# Patient Record
Sex: Male | Born: 1940 | Race: White | Hispanic: No | Smoking: Current some day smoker
Health system: Southern US, Community
[De-identification: ages and names within clinical notes are randomized; demographics above are authoritative.]

## PROBLEM LIST (undated history)

## (undated) DIAGNOSIS — R0609 Other forms of dyspnea: Secondary | ICD-10-CM

## (undated) DIAGNOSIS — M1990 Unspecified osteoarthritis, unspecified site: Secondary | ICD-10-CM

## (undated) DIAGNOSIS — F329 Major depressive disorder, single episode, unspecified: Secondary | ICD-10-CM

## (undated) DIAGNOSIS — K08109 Complete loss of teeth, unspecified cause, unspecified class: Secondary | ICD-10-CM

## (undated) DIAGNOSIS — C61 Malignant neoplasm of prostate: Secondary | ICD-10-CM

## (undated) DIAGNOSIS — R351 Nocturia: Secondary | ICD-10-CM

## (undated) DIAGNOSIS — K219 Gastro-esophageal reflux disease without esophagitis: Secondary | ICD-10-CM

## (undated) DIAGNOSIS — J449 Chronic obstructive pulmonary disease, unspecified: Secondary | ICD-10-CM

## (undated) DIAGNOSIS — F419 Anxiety disorder, unspecified: Secondary | ICD-10-CM

## (undated) DIAGNOSIS — R0600 Dyspnea, unspecified: Secondary | ICD-10-CM

## (undated) DIAGNOSIS — F32A Depression, unspecified: Secondary | ICD-10-CM

## (undated) DIAGNOSIS — K000 Anodontia: Secondary | ICD-10-CM

## (undated) DIAGNOSIS — G4730 Sleep apnea, unspecified: Secondary | ICD-10-CM

## (undated) DIAGNOSIS — G629 Polyneuropathy, unspecified: Secondary | ICD-10-CM

## (undated) DIAGNOSIS — Z973 Presence of spectacles and contact lenses: Secondary | ICD-10-CM

## (undated) DIAGNOSIS — Z8669 Personal history of other diseases of the nervous system and sense organs: Secondary | ICD-10-CM

## (undated) DIAGNOSIS — H43392 Other vitreous opacities, left eye: Secondary | ICD-10-CM

## (undated) DIAGNOSIS — R51 Headache: Secondary | ICD-10-CM

## (undated) HISTORY — PX: CERVICAL DISC SURGERY: SHX588

## (undated) HISTORY — PX: PROSTATE BIOPSY: SHX241

## (undated) HISTORY — PX: CATARACT EXTRACTION W/ INTRAOCULAR LENS IMPLANT: SHX1309

---

## 2001-03-22 ENCOUNTER — Encounter: Admission: RE | Admit: 2001-03-22 | Discharge: 2001-06-20 | Payer: Self-pay

## 2001-08-18 ENCOUNTER — Encounter: Payer: Self-pay | Admitting: Neurosurgery

## 2001-08-18 ENCOUNTER — Encounter: Admission: RE | Admit: 2001-08-18 | Discharge: 2001-08-18 | Payer: Self-pay | Admitting: Neurosurgery

## 2001-09-06 ENCOUNTER — Encounter: Payer: Self-pay | Admitting: Neurosurgery

## 2001-09-12 ENCOUNTER — Encounter: Payer: Self-pay | Admitting: Neurosurgery

## 2001-09-12 ENCOUNTER — Ambulatory Visit (HOSPITAL_COMMUNITY): Admission: RE | Admit: 2001-09-12 | Discharge: 2001-09-13 | Payer: Self-pay | Admitting: Neurosurgery

## 2001-11-21 ENCOUNTER — Encounter: Payer: Self-pay | Admitting: Neurosurgery

## 2001-11-21 ENCOUNTER — Encounter: Admission: RE | Admit: 2001-11-21 | Discharge: 2001-11-21 | Payer: Self-pay | Admitting: Neurosurgery

## 2004-07-30 ENCOUNTER — Ambulatory Visit (HOSPITAL_COMMUNITY): Admission: RE | Admit: 2004-07-30 | Discharge: 2004-07-30 | Payer: Self-pay | Admitting: General Surgery

## 2004-11-28 ENCOUNTER — Ambulatory Visit (HOSPITAL_COMMUNITY): Admission: RE | Admit: 2004-11-28 | Discharge: 2004-11-28 | Payer: Self-pay | Admitting: Family Medicine

## 2005-06-03 ENCOUNTER — Ambulatory Visit (HOSPITAL_COMMUNITY): Admission: RE | Admit: 2005-06-03 | Discharge: 2005-06-03 | Payer: Self-pay | Admitting: General Surgery

## 2005-09-01 ENCOUNTER — Emergency Department (HOSPITAL_COMMUNITY): Admission: EM | Admit: 2005-09-01 | Discharge: 2005-09-01 | Payer: Self-pay | Admitting: Emergency Medicine

## 2011-01-26 ENCOUNTER — Other Ambulatory Visit: Payer: Self-pay | Admitting: Neurosurgery

## 2011-01-26 DIAGNOSIS — M542 Cervicalgia: Secondary | ICD-10-CM

## 2011-02-01 ENCOUNTER — Ambulatory Visit
Admission: RE | Admit: 2011-02-01 | Discharge: 2011-02-01 | Disposition: A | Discharge status: Home / Self Care | Payer: Medicare Other | Source: Ambulatory Visit | Attending: Neurosurgery | Admitting: Neurosurgery

## 2011-02-01 ENCOUNTER — Other Ambulatory Visit: Payer: Self-pay | Admitting: Neurosurgery

## 2011-02-01 DIAGNOSIS — M542 Cervicalgia: Secondary | ICD-10-CM

## 2011-02-02 ENCOUNTER — Other Ambulatory Visit: Payer: Self-pay

## 2011-02-04 ENCOUNTER — Ambulatory Visit
Admission: RE | Admit: 2011-02-04 | Discharge: 2011-02-04 | Disposition: A | Discharge status: Home / Self Care | Payer: Medicare Other | Source: Ambulatory Visit | Attending: Neurosurgery | Admitting: Neurosurgery

## 2012-05-25 ENCOUNTER — Other Ambulatory Visit: Payer: Self-pay | Admitting: Neurosurgery

## 2012-05-26 ENCOUNTER — Encounter (HOSPITAL_COMMUNITY): Payer: Self-pay | Admitting: Pharmacy Technician

## 2012-05-31 ENCOUNTER — Encounter (HOSPITAL_COMMUNITY): Payer: Self-pay

## 2012-05-31 ENCOUNTER — Encounter (HOSPITAL_COMMUNITY)
Admission: RE | Admit: 2012-05-31 | Discharge: 2012-05-31 | Disposition: A | Discharge status: Home / Self Care | Payer: PRIVATE HEALTH INSURANCE | Source: Ambulatory Visit | Attending: Neurosurgery | Admitting: Neurosurgery

## 2012-05-31 ENCOUNTER — Encounter (HOSPITAL_COMMUNITY)
Admission: RE | Admit: 2012-05-31 | Discharge: 2012-05-31 | Disposition: A | Discharge status: Home / Self Care | Payer: PRIVATE HEALTH INSURANCE | Source: Ambulatory Visit | Attending: Anesthesiology | Admitting: Anesthesiology

## 2012-05-31 HISTORY — DX: Headache: R51

## 2012-05-31 HISTORY — DX: Major depressive disorder, single episode, unspecified: F32.9

## 2012-05-31 HISTORY — DX: Gastro-esophageal reflux disease without esophagitis: K21.9

## 2012-05-31 HISTORY — DX: Chronic obstructive pulmonary disease, unspecified: J44.9

## 2012-05-31 HISTORY — DX: Depression, unspecified: F32.A

## 2012-05-31 LAB — CBC WITH DIFFERENTIAL/PLATELET
Basophils Absolute: 0.1 10*3/uL (ref 0.0–0.1)
Eosinophils Relative: 6 % — ABNORMAL HIGH (ref 0–5)
Lymphocytes Relative: 31 % (ref 12–46)
Lymphs Abs: 3.2 10*3/uL (ref 0.7–4.0)
MCV: 92.2 fL (ref 78.0–100.0)
Monocytes Relative: 10 % (ref 3–12)
Neutrophils Relative %: 51 % (ref 43–77)

## 2012-05-31 LAB — SURGICAL PCR SCREEN: MRSA, PCR: NEGATIVE

## 2012-05-31 NOTE — Pre-Procedure Instructions (Signed)
Todd Keith  05/31/2012   Your procedure is scheduled on:  06-05-2012  Report to [facility] at 11:00 AM.  Call this number if you have problems the morning of surgery: [phone]   Remember:   Do not eat food or drink liquids after midnight.   Take these medicines the morning of surgery with A SIP OF WATER:inhaler as needed,cymbalta,claritin,pain medications as needed    Do not wear jewelry,  Do not wear lotions, powders, or perfumes. You may wear deodorant.  Do not shave 48 hours prior to surgery. Men may shave face and neck.  Do not bring valuables to the hospital.  Contacts, dentures or bridgework may not be worn into surgery.  Leave suitcase in the car. After surgery it may be brought to your room.   For patients admitted to the hospital, checkout time is 11:00 AM the day of discharge.   Patients discharged the day of surgery will not be allowed to drive home.   Special Instructions: Shower using CHG 2 nights before surgery and the night before surgery.  If you shower the day of surgery use CHG.  Use special wash - you have one bottle of CHG for all showers.  You should use approximately 1/3 of the bottle for each shower.   Please read over the following fact sheets that you were given: Pain Booklet, Coughing and Deep Breathing and Surgical Site Infection Prevention

## 2012-06-01 ENCOUNTER — Inpatient Hospital Stay (HOSPITAL_COMMUNITY): Admission: RE | Admit: 2012-06-01 | Payer: Medicare Other | Source: Ambulatory Visit

## 2012-06-04 MED ORDER — CEFAZOLIN SODIUM-DEXTROSE 2-3 GM-% IV SOLR
2.0000 g | INTRAVENOUS | Status: AC
  Administered 2012-06-05: 2 g via INTRAVENOUS
  Filled 2012-06-04: qty 50

## 2012-06-05 ENCOUNTER — Encounter (HOSPITAL_COMMUNITY): Payer: Self-pay

## 2012-06-05 ENCOUNTER — Encounter (HOSPITAL_COMMUNITY): Payer: Self-pay | Admitting: Anesthesiology

## 2012-06-05 ENCOUNTER — Inpatient Hospital Stay (HOSPITAL_COMMUNITY): Payer: PRIVATE HEALTH INSURANCE

## 2012-06-05 ENCOUNTER — Inpatient Hospital Stay (HOSPITAL_COMMUNITY)
Admission: RE | Admit: 2012-06-05 | Discharge: 2012-06-06 | DRG: 473 | Disposition: A | Discharge status: Home / Self Care | Payer: PRIVATE HEALTH INSURANCE | Source: Ambulatory Visit | Attending: Neurosurgery | Admitting: Neurosurgery

## 2012-06-05 ENCOUNTER — Inpatient Hospital Stay (HOSPITAL_COMMUNITY): Payer: PRIVATE HEALTH INSURANCE | Admitting: Anesthesiology

## 2012-06-05 ENCOUNTER — Encounter (HOSPITAL_COMMUNITY)
Admission: RE | Disposition: A | Discharge status: Home / Self Care | Payer: Self-pay | Source: Ambulatory Visit | Attending: Neurosurgery

## 2012-06-05 DIAGNOSIS — Z01812 Encounter for preprocedural laboratory examination: Secondary | ICD-10-CM

## 2012-06-05 DIAGNOSIS — F3289 Other specified depressive episodes: Secondary | ICD-10-CM | POA: Diagnosis present

## 2012-06-05 DIAGNOSIS — Z79899 Other long term (current) drug therapy: Secondary | ICD-10-CM

## 2012-06-05 DIAGNOSIS — Z981 Arthrodesis status: Secondary | ICD-10-CM

## 2012-06-05 DIAGNOSIS — J449 Chronic obstructive pulmonary disease, unspecified: Secondary | ICD-10-CM | POA: Diagnosis present

## 2012-06-05 DIAGNOSIS — J4489 Other specified chronic obstructive pulmonary disease: Secondary | ICD-10-CM | POA: Diagnosis present

## 2012-06-05 DIAGNOSIS — F17200 Nicotine dependence, unspecified, uncomplicated: Secondary | ICD-10-CM | POA: Diagnosis present

## 2012-06-05 DIAGNOSIS — M47812 Spondylosis without myelopathy or radiculopathy, cervical region: Principal | ICD-10-CM | POA: Diagnosis present

## 2012-06-05 DIAGNOSIS — F329 Major depressive disorder, single episode, unspecified: Secondary | ICD-10-CM | POA: Diagnosis present

## 2012-06-05 DIAGNOSIS — K219 Gastro-esophageal reflux disease without esophagitis: Secondary | ICD-10-CM | POA: Diagnosis present

## 2012-06-05 HISTORY — PX: ANTERIOR CERVICAL DECOMP/DISCECTOMY FUSION: SHX1161

## 2012-06-05 SURGERY — ANTERIOR CERVICAL DECOMPRESSION/DISCECTOMY FUSION 1 LEVEL/HARDWARE REMOVAL
Anesthesia: General | Site: Neck | Wound class: Clean

## 2012-06-05 MED ORDER — DEXAMETHASONE SODIUM PHOSPHATE 10 MG/ML IJ SOLN
10.0000 mg | INTRAMUSCULAR | Status: AC
  Administered 2012-06-05: 10 mg via INTRAVENOUS
  Filled 2012-06-05: qty 1

## 2012-06-05 MED ORDER — HYDROMORPHONE HCL PF 1 MG/ML IJ SOLN
0.2500 mg | INTRAMUSCULAR | Status: DC | PRN
  Administered 2012-06-05 (×2): 0.5 mg via INTRAVENOUS

## 2012-06-05 MED ORDER — HYDROMORPHONE HCL PF 1 MG/ML IJ SOLN
0.5000 mg | INTRAMUSCULAR | Status: DC | PRN
  Administered 2012-06-05 (×2): 1 mg via INTRAVENOUS
  Filled 2012-06-05 (×2): qty 1

## 2012-06-05 MED ORDER — ARTIFICIAL TEARS OP OINT
TOPICAL_OINTMENT | OPHTHALMIC | Status: DC | PRN
  Administered 2012-06-05: 1 via OPHTHALMIC

## 2012-06-05 MED ORDER — PHENOL 1.4 % MT LIQD
1.0000 | OROMUCOSAL | Status: DC | PRN
  Administered 2012-06-05: 1 via OROMUCOSAL
  Filled 2012-06-05: qty 177

## 2012-06-05 MED ORDER — THROMBIN 5000 UNITS EX SOLR
CUTANEOUS | Status: DC | PRN
  Administered 2012-06-05 (×2): 5000 [IU] via TOPICAL

## 2012-06-05 MED ORDER — HYDROMORPHONE HCL PF 1 MG/ML IJ SOLN
INTRAMUSCULAR | Status: DC | PRN
  Administered 2012-06-05: 1 mg via INTRAVENOUS

## 2012-06-05 MED ORDER — THROMBIN 5000 UNITS EX SOLR
OROMUCOSAL | Status: DC | PRN
  Administered 2012-06-05: 15:00:00 via TOPICAL

## 2012-06-05 MED ORDER — ALBUTEROL SULFATE HFA 108 (90 BASE) MCG/ACT IN AERS
2.0000 | INHALATION_SPRAY | Freq: Four times a day (QID) | RESPIRATORY_TRACT | Status: DC
  Administered 2012-06-05 – 2012-06-06 (×3): 2 via RESPIRATORY_TRACT
  Filled 2012-06-05: qty 6.7

## 2012-06-05 MED ORDER — OXYCODONE HCL 5 MG/5ML PO SOLN: 5.0000 mg | Freq: Once | ORAL | Status: AC | PRN

## 2012-06-05 MED ORDER — FENTANYL CITRATE 0.05 MG/ML IJ SOLN
INTRAMUSCULAR | Status: DC | PRN
  Administered 2012-06-05: 100 ug via INTRAVENOUS
  Administered 2012-06-05 (×2): 50 ug via INTRAVENOUS

## 2012-06-05 MED ORDER — GABAPENTIN 300 MG PO CAPS
300.0000 mg | ORAL_CAPSULE | Freq: Two times a day (BID) | ORAL | Status: DC
  Administered 2012-06-05 – 2012-06-06 (×2): 300 mg via ORAL
  Filled 2012-06-05 (×3): qty 1

## 2012-06-05 MED ORDER — ALUM & MAG HYDROXIDE-SIMETH 200-200-20 MG/5ML PO SUSP: 30.0000 mL | Freq: Four times a day (QID) | ORAL | Status: DC | PRN

## 2012-06-05 MED ORDER — SENNA 8.6 MG PO TABS
1.0000 | ORAL_TABLET | Freq: Two times a day (BID) | ORAL | Status: DC
  Administered 2012-06-05 – 2012-06-06 (×2): 8.6 mg via ORAL
  Filled 2012-06-05 (×3): qty 1

## 2012-06-05 MED ORDER — CYCLOBENZAPRINE HCL 10 MG PO TABS
10.0000 mg | ORAL_TABLET | Freq: Three times a day (TID) | ORAL | Status: DC | PRN
  Administered 2012-06-05 – 2012-06-06 (×2): 10 mg via ORAL
  Filled 2012-06-05 (×2): qty 1

## 2012-06-05 MED ORDER — HYDROMORPHONE HCL PF 1 MG/ML IJ SOLN
INTRAMUSCULAR | Status: AC
  Filled 2012-06-05: qty 1

## 2012-06-05 MED ORDER — SODIUM CHLORIDE 0.9 % IJ SOLN
3.0000 mL | Freq: Two times a day (BID) | INTRAMUSCULAR | Status: DC
  Administered 2012-06-05 – 2012-06-06 (×2): 3 mL via INTRAVENOUS

## 2012-06-05 MED ORDER — MENTHOL 3 MG MT LOZG
1.0000 | LOZENGE | OROMUCOSAL | Status: DC | PRN
  Filled 2012-06-05: qty 9

## 2012-06-05 MED ORDER — DULOXETINE HCL 60 MG PO CPEP
60.0000 mg | ORAL_CAPSULE | Freq: Every day | ORAL | Status: DC
  Administered 2012-06-05 – 2012-06-06 (×2): 60 mg via ORAL
  Filled 2012-06-05 (×2): qty 1

## 2012-06-05 MED ORDER — OXYCODONE HCL 5 MG PO TABS
ORAL_TABLET | ORAL | Status: AC
  Filled 2012-06-05: qty 1

## 2012-06-05 MED ORDER — SODIUM CHLORIDE 0.9 % IJ SOLN: 3.0000 mL | INTRAMUSCULAR | Status: DC | PRN

## 2012-06-05 MED ORDER — SODIUM CHLORIDE 0.9 % IV SOLN
INTRAVENOUS | Status: AC
  Filled 2012-06-05: qty 500

## 2012-06-05 MED ORDER — PROPOFOL 10 MG/ML IV BOLUS
INTRAVENOUS | Status: DC | PRN
  Administered 2012-06-05: 150 mg via INTRAVENOUS

## 2012-06-05 MED ORDER — ONDANSETRON HCL 4 MG/2ML IJ SOLN: 4.0000 mg | Freq: Four times a day (QID) | INTRAMUSCULAR | Status: DC | PRN

## 2012-06-05 MED ORDER — VECURONIUM BROMIDE 10 MG IV SOLR
INTRAVENOUS | Status: DC | PRN
  Administered 2012-06-05: 2 mg via INTRAVENOUS

## 2012-06-05 MED ORDER — ONDANSETRON HCL 4 MG/2ML IJ SOLN
INTRAMUSCULAR | Status: DC | PRN
  Administered 2012-06-05: 4 mg via INTRAVENOUS

## 2012-06-05 MED ORDER — LACTATED RINGERS IV SOLN
INTRAVENOUS | Status: DC | PRN
  Administered 2012-06-05 (×2): via INTRAVENOUS

## 2012-06-05 MED ORDER — MORPHINE SULFATE 15 MG PO TABS
30.0000 mg | ORAL_TABLET | ORAL | Status: DC | PRN
  Administered 2012-06-06 (×2): 30 mg via ORAL
  Filled 2012-06-05 (×2): qty 2

## 2012-06-05 MED ORDER — GLYCOPYRROLATE 0.2 MG/ML IJ SOLN
INTRAMUSCULAR | Status: DC | PRN
  Administered 2012-06-05: 0.6 mg via INTRAVENOUS

## 2012-06-05 MED ORDER — ONDANSETRON HCL 4 MG/2ML IJ SOLN: 4.0000 mg | INTRAMUSCULAR | Status: DC | PRN

## 2012-06-05 MED ORDER — SODIUM CHLORIDE 0.9 % IR SOLN
Status: DC | PRN
  Administered 2012-06-05: 12:00:00

## 2012-06-05 MED ORDER — OXYCODONE HCL 5 MG PO TABS
5.0000 mg | ORAL_TABLET | Freq: Once | ORAL | Status: AC | PRN
Start: 2012-06-05 — End: 2012-06-05
  Administered 2012-06-05: 5 mg via ORAL

## 2012-06-05 MED ORDER — BACITRACIN 50000 UNITS IM SOLR
INTRAMUSCULAR | Status: AC
  Filled 2012-06-05: qty 1

## 2012-06-05 MED ORDER — ACETAMINOPHEN 325 MG PO TABS: 650.0000 mg | ORAL_TABLET | ORAL | Status: DC | PRN

## 2012-06-05 MED ORDER — ROCURONIUM BROMIDE 100 MG/10ML IV SOLN
INTRAVENOUS | Status: DC | PRN
  Administered 2012-06-05: 50 mg via INTRAVENOUS

## 2012-06-05 MED ORDER — ACETAMINOPHEN 650 MG RE SUPP: 650.0000 mg | RECTAL | Status: DC | PRN

## 2012-06-05 MED ORDER — LORATADINE 10 MG PO TABS
10.0000 mg | ORAL_TABLET | Freq: Every day | ORAL | Status: DC
  Administered 2012-06-05 – 2012-06-06 (×2): 10 mg via ORAL
  Filled 2012-06-05 (×2): qty 1

## 2012-06-05 MED ORDER — TIZANIDINE HCL 4 MG PO TABS
4.0000 mg | ORAL_TABLET | Freq: Three times a day (TID) | ORAL | Status: DC
  Administered 2012-06-05 – 2012-06-06 (×3): 4 mg via ORAL
  Filled 2012-06-05 (×5): qty 1

## 2012-06-05 MED ORDER — NEOSTIGMINE METHYLSULFATE 1 MG/ML IJ SOLN
INTRAMUSCULAR | Status: DC | PRN
  Administered 2012-06-05: 4 mg via INTRAVENOUS

## 2012-06-05 MED ORDER — CEFAZOLIN SODIUM 1-5 GM-% IV SOLN
1.0000 g | Freq: Three times a day (TID) | INTRAVENOUS | Status: AC
  Administered 2012-06-05 – 2012-06-06 (×2): 1 g via INTRAVENOUS
  Filled 2012-06-05 (×2): qty 50

## 2012-06-05 MED ORDER — HEMOSTATIC AGENTS (NO CHARGE) OPTIME
TOPICAL | Status: DC | PRN
  Administered 2012-06-05: 1 via TOPICAL

## 2012-06-05 MED ORDER — OXYCODONE-ACETAMINOPHEN 5-325 MG PO TABS
1.0000 | ORAL_TABLET | ORAL | Status: DC | PRN
  Administered 2012-06-05 – 2012-06-06 (×3): 2 via ORAL
  Filled 2012-06-05 (×3): qty 2

## 2012-06-05 MED ORDER — 0.9 % SODIUM CHLORIDE (POUR BTL) OPTIME
TOPICAL | Status: DC | PRN
  Administered 2012-06-05: 1000 mL

## 2012-06-05 MED ORDER — LIDOCAINE HCL (CARDIAC) 20 MG/ML IV SOLN
INTRAVENOUS | Status: DC | PRN
  Administered 2012-06-05: 50 mg via INTRAVENOUS

## 2012-06-05 MED ORDER — HYDROCODONE-ACETAMINOPHEN 5-325 MG PO TABS: 1.0000 | ORAL_TABLET | ORAL | Status: DC | PRN

## 2012-06-05 MED ORDER — NORTRIPTYLINE HCL 25 MG PO CAPS
50.0000 mg | ORAL_CAPSULE | Freq: Every day | ORAL | Status: DC
  Administered 2012-06-05: 50 mg via ORAL
  Filled 2012-06-05 (×2): qty 2

## 2012-06-05 MED ORDER — ZOLPIDEM TARTRATE 5 MG PO TABS: 5.0000 mg | ORAL_TABLET | Freq: Every evening | ORAL | Status: DC | PRN

## 2012-06-05 SURGICAL SUPPLY — 56 items
BAG DECANTER FOR FLEXI CONT (MISCELLANEOUS) ×2 IMPLANT
BENZOIN TINCTURE PRP APPL 2/3 (GAUZE/BANDAGES/DRESSINGS) ×2 IMPLANT
BIT DRILL NEURO 2X3.1 SFT TUCH (MISCELLANEOUS) ×1 IMPLANT
BRUSH SCRUB EZ PLAIN DRY (MISCELLANEOUS) ×2 IMPLANT
BUR MATCHSTICK NEURO 3.0 LAGG (BURR) ×2 IMPLANT
CANISTER SUCTION 2500CC (MISCELLANEOUS) ×2 IMPLANT
CLOTH BEACON ORANGE TIMEOUT ST (SAFETY) ×2 IMPLANT
CONT SPEC 4OZ CLIKSEAL STRL BL (MISCELLANEOUS) ×2 IMPLANT
DRAPE C-ARM 42X72 X-RAY (DRAPES) ×4 IMPLANT
DRAPE LAPAROTOMY 100X72 PEDS (DRAPES) ×2 IMPLANT
DRAPE MICROSCOPE ZEISS OPMI (DRAPES) ×2 IMPLANT
DRAPE POUCH INSTRU U-SHP 10X18 (DRAPES) ×2 IMPLANT
DRILL BIT (BIT) ×2 IMPLANT
DRILL NEURO 2X3.1 SOFT TOUCH (MISCELLANEOUS) ×2
ELECT COATED BLADE 2.86 ST (ELECTRODE) ×2 IMPLANT
ELECT REM PT RETURN 9FT ADLT (ELECTROSURGICAL) ×2
ELECTRODE REM PT RTRN 9FT ADLT (ELECTROSURGICAL) ×1 IMPLANT
EVACUATOR 1/8 PVC DRAIN (DRAIN) ×2 IMPLANT
GAUZE SPONGE 4X4 16PLY XRAY LF (GAUZE/BANDAGES/DRESSINGS) IMPLANT
GLOVE BIO SURGEON STRL SZ8.5 (GLOVE) ×2 IMPLANT
GLOVE ECLIPSE 7.5 STRL STRAW (GLOVE) ×8 IMPLANT
GLOVE ECLIPSE 8.5 STRL (GLOVE) ×2 IMPLANT
GLOVE EXAM NITRILE LRG STRL (GLOVE) IMPLANT
GLOVE EXAM NITRILE MD LF STRL (GLOVE) IMPLANT
GLOVE EXAM NITRILE XL STR (GLOVE) IMPLANT
GLOVE EXAM NITRILE XS STR PU (GLOVE) IMPLANT
GLOVE INDICATOR 8.0 STRL GRN (GLOVE) ×4 IMPLANT
GLOVE OPTIFIT SS 8.0 STRL (GLOVE) ×2 IMPLANT
GOWN BRE IMP SLV AUR LG STRL (GOWN DISPOSABLE) IMPLANT
GOWN BRE IMP SLV AUR XL STRL (GOWN DISPOSABLE) ×2 IMPLANT
GOWN STRL REIN 2XL LVL4 (GOWN DISPOSABLE) ×6 IMPLANT
HEAD HALTER (SOFTGOODS) ×2 IMPLANT
HEMOSTAT POWDER SURGIFOAM 1G (HEMOSTASIS) ×2 IMPLANT
KIT BASIN OR (CUSTOM PROCEDURE TRAY) ×2 IMPLANT
KIT ROOM TURNOVER OR (KITS) ×2 IMPLANT
NEEDLE SPNL 20GX3.5 QUINCKE YW (NEEDLE) ×2 IMPLANT
NS IRRIG 1000ML POUR BTL (IV SOLUTION) ×2 IMPLANT
PACK LAMINECTOMY NEURO (CUSTOM PROCEDURE TRAY) ×2 IMPLANT
PAD ARMBOARD 7.5X6 YLW CONV (MISCELLANEOUS) ×6 IMPLANT
PLATE ELITE VISION 25MM (Plate) ×2 IMPLANT
RUBBERBAND STERILE (MISCELLANEOUS) ×4 IMPLANT
SCREW ST 13X4XST VA NS SPNE (Screw) ×4 IMPLANT
SCREW ST VAR 4 ATL (Screw) ×4 IMPLANT
SPACER BONE CORNERSTONE 7X14 (Orthopedic Implant) ×2 IMPLANT
SPONGE GAUZE 4X4 12PLY (GAUZE/BANDAGES/DRESSINGS) ×2 IMPLANT
SPONGE INTESTINAL PEANUT (DISPOSABLE) ×2 IMPLANT
SPONGE SURGIFOAM ABS GEL SZ50 (HEMOSTASIS) ×2 IMPLANT
STRIP CLOSURE SKIN 1/2X4 (GAUZE/BANDAGES/DRESSINGS) ×2 IMPLANT
SUT PDS AB 5-0 P3 18 (SUTURE) ×2 IMPLANT
SUT VIC AB 3-0 SH 8-18 (SUTURE) ×2 IMPLANT
SYR 20ML ECCENTRIC (SYRINGE) ×2 IMPLANT
TAPE CLOTH 4X10 WHT NS (GAUZE/BANDAGES/DRESSINGS) ×2 IMPLANT
TAPE CLOTH SURG 4X10 WHT LF (GAUZE/BANDAGES/DRESSINGS) ×2 IMPLANT
TOWEL OR 17X24 6PK STRL BLUE (TOWEL DISPOSABLE) ×2 IMPLANT
TOWEL OR 17X26 10 PK STRL BLUE (TOWEL DISPOSABLE) ×2 IMPLANT
WATER STERILE IRR 1000ML POUR (IV SOLUTION) ×2 IMPLANT

## 2012-06-05 NOTE — Anesthesia Preprocedure Evaluation (Addendum)
Anesthesia Evaluation  Patient identified by MRN, date of birth, ID band Patient awake    Reviewed: Allergy & Precautions, H&P , NPO status , Patient's Chart, lab work & pertinent test results  Airway Mallampati: II  Neck ROM: full    Dental  (+) Dental Advidsory Given   Pulmonary COPDCurrent Smoker,          Cardiovascular     Neuro/Psych  Headaches, Depression    GI/Hepatic GERD-  ,  Endo/Other    Renal/GU      Musculoskeletal   Abdominal   Peds  Hematology   Anesthesia Other Findings   Reproductive/Obstetrics                          Anesthesia Physical Anesthesia Plan  ASA: II  Anesthesia Plan: General   Post-op Pain Management:    Induction: Intravenous  Airway Management Planned: Oral ETT  Additional Equipment:   Intra-op Plan:   Post-operative Plan: Extubation in OR  Informed Consent: I have reviewed the patients History and Physical, chart, labs and discussed the procedure including the risks, benefits and alternatives for the proposed anesthesia with the patient or authorized representative who has indicated his/her understanding and acceptance.   Dental Advisory Given  Plan Discussed with: CRNA, Surgeon and Anesthesiologist  Anesthesia Plan Comments:        Anesthesia Quick Evaluation

## 2012-06-05 NOTE — Transfer of Care (Signed)
Immediate Anesthesia Transfer of Care Note  Patient: Todd Keith  Procedure(s) Performed: Procedure(s): Cervical six - seven  ANTERIOR CERVICAL DECOMPRESSION/DISCECTOMY FUSION 1 LEVEL REMOVAL OF HARDWARE Cervical four - six (N/A)  Patient Location: PACU  Anesthesia Type:General  Level of Consciousness: awake, alert  and oriented  Airway & Oxygen Therapy: Patient Spontanous Breathing and Patient connected to face mask oxygen  Post-op Assessment: Report given to PACU RN  Post vital signs: Reviewed and stable  Complications: No apparent anesthesia complications

## 2012-06-05 NOTE — Anesthesia Procedure Notes (Signed)
Procedure Name: Intubation Date/Time: 06/05/2012 1:20 PM Performed by: Carmela Rima Pre-anesthesia Checklist: Patient identified, Timeout performed, Emergency Drugs available, Suction available and Patient being monitored Patient Re-evaluated:Patient Re-evaluated prior to inductionOxygen Delivery Method: Circle system utilized Preoxygenation: Pre-oxygenation with 100% oxygen Intubation Type: IV induction Ventilation: Mask ventilation without difficulty Laryngoscope Size: Mac and 3 Grade View: Grade I Tube type: Oral Tube size: 7.5 mm Number of attempts: 1 Placement Confirmation: ETT inserted through vocal cords under direct vision,  positive ETCO2 and breath sounds checked- equal and bilateral Secured at: 23 cm Tube secured with: Tape Dental Injury: Teeth and Oropharynx as per pre-operative assessment

## 2012-06-05 NOTE — Plan of Care (Signed)
Problem: Consults Goal: Diagnosis - Spinal Surgery Outcome: Completed/Met Date Met:  06/05/12 Cervical Spine Fusion     

## 2012-06-05 NOTE — Anesthesia Postprocedure Evaluation (Signed)
Anesthesia Post Note  Patient: Todd Keith  Procedure(s) Performed: Procedure(s) (LRB): Cervical six - seven  ANTERIOR CERVICAL DECOMPRESSION/DISCECTOMY FUSION 1 LEVEL REMOVAL OF HARDWARE Cervical four - six (N/A)  Anesthesia type: General  Patient location: PACU  Post pain: Pain level controlled and Adequate analgesia  Post assessment: Post-op Vital signs reviewed, Patient's Cardiovascular Status Stable, Respiratory Function Stable, Patent Airway and Pain level controlled  Last Vitals:  Filed Vitals:   06/05/12 1525  BP: 139/75  Pulse: 106  Temp: 37.3 C  Resp: 12    Post vital signs: Reviewed and stable  Level of consciousness: awake, alert  and oriented  Complications: No apparent anesthesia complications

## 2012-06-05 NOTE — H&P (Signed)
Todd Keith is an 72 y.o. male.   Chief Complaint: Neck and bilateral upper extremity pain HPI: 72 year old male status post previous C3-C6 anterior cervical discectomies infusion presents with worsening neck and bilateral R. pain. Symptoms have been refractory to conservative management. Patient is miserable with this current situation. Workup demonstrates evidence of marked spondylosis and stenosis at C6-7 with spinal cord compression and exiting nerve root compression bilaterally. Patient presents now for C6-7 anterior cervical decompression infusion in hopes of improving his symptoms. This will require removal of his previously placed anterior plate.  Past Medical History  Diagnosis Date  . Depression   . COPD (chronic obstructive pulmonary disease)     has improved since stopped smoking  . GERD (gastroesophageal reflux disease)   . Headache     Past Surgical History  Procedure Laterality Date  . Cervical disc surgery      No family history on file. Social History:  reports that he has been smoking Cigarettes.  He has been smoking about 0.00 packs per day. He does not have any smokeless tobacco history on file. He reports that he does not drink alcohol or use illicit drugs.  Allergies: No Known Allergies  Medications Prior to Admission  Medication Sig Dispense Refill  . albuterol (PROAIR HFA) 108 (90 BASE) MCG/ACT inhaler Inhale 2 puffs into the lungs 4 (four) times daily.      . DULoxetine (CYMBALTA) 60 MG capsule Take 60 mg by mouth daily.      Marland Kitchen gabapentin (NEURONTIN) 300 MG capsule Take 300 mg by mouth 2 (two) times daily.      Marland Kitchen loratadine (CLARITIN) 10 MG tablet Take 10 mg by mouth daily.      . nortriptyline (PAMELOR) 50 MG capsule Take 50 mg by mouth at bedtime.      Marland Kitchen oxymorphone (OPANA) 10 MG tablet Take 10 mg by mouth 2 (two) times daily.      Marland Kitchen tiZANidine (ZANAFLEX) 4 MG tablet Take 4 mg by mouth 3 (three) times daily.        No results found for this or any  previous visit (from the past 48 hour(s)). No results found.  Review of Systems  Constitutional: Negative.   HENT: Negative.   Eyes: Negative.   Respiratory: Negative.   Cardiovascular: Negative.   Gastrointestinal: Negative.   Genitourinary: Negative.   Musculoskeletal: Negative.   Skin: Negative.   Neurological: Negative.   Endo/Heme/Allergies: Negative.   Psychiatric/Behavioral: Negative.     Blood pressure 106/70, pulse 79, temperature 97.1 F (36.2 C), temperature source Oral, resp. rate 18, SpO2 97.00%. Physical Exam  Nursing note and vitals reviewed. Constitutional: He is oriented to person, place, and time. He appears well-developed and well-nourished. No distress.  HENT:  Head: Normocephalic and atraumatic.  Right Ear: External ear normal.  Left Ear: External ear normal.  Nose: Nose normal.  Mouth/Throat: Oropharynx is clear and moist.  Eyes: Conjunctivae and EOM are normal. Pupils are equal, round, and reactive to light. Right eye exhibits no discharge. Left eye exhibits no discharge.  Neck: Normal range of motion. Neck supple. No tracheal deviation present. No thyromegaly present.  Cardiovascular: Normal rate, regular rhythm, normal heart sounds and intact distal pulses.  Exam reveals no friction rub.   No murmur heard. Respiratory: Effort normal and breath sounds normal. No respiratory distress. He has no wheezes.  GI: Soft. Bowel sounds are normal. He exhibits no distension. There is no tenderness.  Neurological: He is alert and oriented  to person, place, and time. He has normal reflexes. He displays normal reflexes. No cranial nerve deficit. He exhibits normal muscle tone. Coordination normal.  Skin: Skin is warm and dry. He is not diaphoretic.  Psychiatric: He has a normal mood and affect. His behavior is normal. Judgment and thought content normal.     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 Plan C6-7 anterior cervical discectomy and  fusion with allograft and her plating. Plan removal of C4-C6 anterior cervical plate. Risks and benefits have been explained. Patient wishes to proceed.  Todd Keith A 06/05/2012, 1:01 PM

## 2012-06-05 NOTE — Op Note (Signed)
Date of procedure: 06/05/2012  Date of dictation: Same  Service: Neurosurgery  Preoperative diagnosis: C6-7 spondylosis with stenosis and radiculopathy. Status post C4-C6 anterior cervical decompression and fusion with instrumentation  Postoperative diagnosis: Same  Procedure Name: C6-7 anterior cervical discectomy and fusion with allograft and her plating. Removal of C4-C6 anterior cervical plate reexploration of C4-C6 anterior cervical fusion.  Surgeon:Amillion Macchia A.Karma Ansley, M.D.  Asst. Surgeon: Lovell Sheehan  Anesthesia: General  Indication: 71 year old male status post previous C4-C6 anterior cervical discectomy and fusion without after plating remotely in the past. Patient presents now with worsening neck and bilateral upper cavity symptoms right greater than left. Workup demonstrates evidence of severe spondylosis with stenosis and neural foraminal stenosis at C6-7. Patient's failed conservative management and presents now for C6-7 anterior cervical decompression and fusion in hopes of improving his symptoms.  Operative note: After induction of general anesthesia patient positioned supine with Extend and help with halter traction. Patient's anterior cervical region was prepped and draped sterilely. 10 please make a linear skin incision overlying the C6 vertebral level. This carried down sharply through the platysma. Dissection then proceeded on the medial border of the cervical as possible and carotid sheath. Trachea and esophagus are mobilized retracted towards the left. Prevertebral fascia stripped off the anterior spinal column. Longus colli muscles elevated bilaterally using electric. Deep self-retaining traction placed intraoperative fluoroscopy is used levels were confirmed. Previous anterior cervical plate from G9-F6 was dissected free disassembled and then removed. The fusion from C4-C6 was quite solid. Disc space at C6-7 and size 15 blade in a rectangle fashion. Anterior ossifies were also  removed. A wide discectomies and performed using pituitary rongeurs for tobacco progress Kerrison or his high-speed drill per almost the disc removed down to level posterior annulus. Marked and brought field these at the remainder of the discectomy. Remaining aspects of annulus and osteophytes removed using high-speed drill down to level posterior lateral and her posterior lateral is elevated and resected so fashion using Kerrison rongeurs. Underlying thecal sac was identified. Y. center decompression and perform undercutting the bodies of C6 and C7. Decompression then proceeded extraforaminal wide anterior foraminotomies were then performed on of course exiting C7 nerve roots bilaterally. This point a very thorough decompression had been achieved. There is no his injury to thecal sac or nerve roots. Wounds that area out solution. A 7 mm cornerstone allograft wedges and packed in place and recessed approximately 1-2 mm in the anterior cortical margin of C6. A 25 mg center for plate was a posterior the C6 and C7 levels. This attached under fluoroscopic guidance and 13 motor verbal screws 2 each at both levels. All 4 screws given a final tightening be solidly within bone. Locking screws were engaged both levels. Final images revealed good position t of the screws at the proper operative level with normal alignment of the spine.. Wound is then irrigated hemostasis was assured with bipolar electrocautery. Medium Hemovac drain was left in the prevertebral space. Wounds and closed in a typical fashion. Steri-Strips and a sterile dressing  were applied. There were no apparent complications. Patient tolerated the procedure well and returns to the recovery room postop.

## 2012-06-05 NOTE — Preoperative (Signed)
Beta Blockers   Reason not to administer Beta Blockers:Not Applicable 

## 2012-06-05 NOTE — Brief Op Note (Signed)
06/05/2012  3:10 PM  PATIENT:  Todd Keith  72 y.o. male  PRE-OPERATIVE DIAGNOSIS:  HNP/stenosis  POST-OPERATIVE DIAGNOSIS:  herniated nucleus pulposis  PROCEDURE:  Procedure(s): Cervical six - seven  ANTERIOR CERVICAL DECOMPRESSION/DISCECTOMY FUSION 1 LEVEL REMOVAL OF HARDWARE Cervical four - six (N/A)  SURGEON:  Surgeon(s) and Role:    * Temple Pacini, MD - Primary    * Cristi Loron, MD - Assisting  PHYSICIAN ASSISTANT:   ASSISTANTS:    ANESTHESIA:   general  EBL:  Total I/O In: 1600 [I.V.:1600] Out: 75 [Blood:75]  BLOOD ADMINISTERED:none  DRAINS: (Medium) Hemovact drain(s) in the Prevertebral space with  Suction Open   LOCAL MEDICATIONS USED:  NONE  SPECIMEN:  No Specimen  DISPOSITION OF SPECIMEN:  N/A  COUNTS:  YES  TOURNIQUET:  * No tourniquets in log *  DICTATION: .Dragon Dictation  PLAN OF CARE: Admit to inpatient   PATIENT DISPOSITION:  PACU - hemodynamically stable.   Delay start of Pharmacological VTE agent (>24hrs) due to surgical blood loss or risk of bleeding: yes

## 2012-06-06 ENCOUNTER — Encounter (HOSPITAL_COMMUNITY): Payer: Self-pay | Admitting: Neurosurgery

## 2012-06-06 MED ORDER — OXYCODONE-ACETAMINOPHEN 5-325 MG PO TABS: 1.0000 | ORAL_TABLET | ORAL | Status: DC | PRN

## 2012-06-06 NOTE — Discharge Summary (Signed)
Physician Discharge Summary  Patient ID: BARY LIMBACH MRN: [id number] DOB/AGE: Jul 29, 1940 71 y.o.  Admit date: 06/05/2012 Discharge date: 06/06/2012  Admission Diagnoses:  Discharge Diagnoses:  Principal Problem:   Cervical spondylosis without myelopathy   Discharged Condition: good  Hospital Course: Patient in the hospital where he underwent uncomplicated C6-7 anterior cervical discectomy and fusion. Postoperatively he is doing well. His pain is much improved and he is ready for discharge home.  Consults:   Significant Diagnostic Studies:   Treatments:   Discharge Exam: Blood pressure 100/68, pulse 88, temperature 97.9 F (36.6 C), temperature source Oral, resp. rate 18, SpO2 94.00%. Awake and alert oriented and appropriate. Cranial nerve function is intact. Motor and sensory function of the extremities are stable. Wound clean and dry. Chest and abdomen benign.  Disposition:      Medication List    TAKE these medications       DULoxetine 60 MG capsule  Commonly known as:  CYMBALTA  Take 60 mg by mouth daily.     gabapentin 300 MG capsule  Commonly known as:  NEURONTIN  Take 300 mg by mouth 2 (two) times daily.     loratadine 10 MG tablet  Commonly known as:  CLARITIN  Take 10 mg by mouth daily.     nortriptyline 50 MG capsule  Commonly known as:  PAMELOR  Take 50 mg by mouth at bedtime.     oxyCODONE-acetaminophen 5-325 MG per tablet  Commonly known as:  PERCOCET/ROXICET  Take 1-2 tablets by mouth every 4 (four) hours as needed.     oxymorphone 10 MG tablet  Commonly known as:  OPANA  Take 10 mg by mouth 2 (two) times daily.     PROAIR HFA 108 (90 BASE) MCG/ACT inhaler  Generic drug:  albuterol  Inhale 2 puffs into the lungs 4 (four) times daily.     tiZANidine 4 MG tablet  Commonly known as:  ZANAFLEX  Take 4 mg by mouth 3 (three) times daily.           Follow-up Information   Follow up with Amyria Komar A, MD. Call in 1 week. (Ask for  Lurena Joiner)    Contact information:   [address] [place] [address] [phone]       Signed: Margert Edsall A 06/06/2012, 11:46 AM

## 2012-06-06 NOTE — Progress Notes (Signed)
UR COMPLETED  

## 2012-09-04 ENCOUNTER — Other Ambulatory Visit: Payer: Self-pay | Admitting: Neurosurgery

## 2012-09-04 DIAGNOSIS — M47812 Spondylosis without myelopathy or radiculopathy, cervical region: Secondary | ICD-10-CM

## 2015-02-06 ENCOUNTER — Encounter (INDEPENDENT_AMBULATORY_CARE_PROVIDER_SITE_OTHER): Payer: Self-pay | Admitting: *Deleted

## 2015-03-20 ENCOUNTER — Ambulatory Visit (INDEPENDENT_AMBULATORY_CARE_PROVIDER_SITE_OTHER): Payer: PRIVATE HEALTH INSURANCE | Admitting: Internal Medicine

## 2015-03-20 ENCOUNTER — Encounter (INDEPENDENT_AMBULATORY_CARE_PROVIDER_SITE_OTHER): Payer: Self-pay | Admitting: Internal Medicine

## 2015-03-21 ENCOUNTER — Encounter (INDEPENDENT_AMBULATORY_CARE_PROVIDER_SITE_OTHER): Payer: Self-pay | Admitting: Internal Medicine

## 2015-12-17 ENCOUNTER — Other Ambulatory Visit: Payer: Self-pay | Admitting: Pain Medicine

## 2016-03-09 ENCOUNTER — Other Ambulatory Visit: Payer: Self-pay | Admitting: Pain Medicine

## 2016-04-05 ENCOUNTER — Other Ambulatory Visit: Payer: Self-pay | Admitting: Pain Medicine

## 2017-01-04 ENCOUNTER — Encounter: Payer: Self-pay | Admitting: Radiation Oncology

## 2017-01-18 ENCOUNTER — Encounter: Payer: Self-pay | Admitting: Radiation Oncology

## 2017-01-18 NOTE — Progress Notes (Signed)
GU Location of Tumor / Histology: prostatic adenocarcinoma  If Prostate Cancer, Gleason Score is (3 + 4) and PSA is (5.91). Prostate volume: 32.93 grams  Todd Keith was referred to Dr. Junious Silk by Barry Dienes, NP on 07/21/2016 for evaluation of an elevated PSA.  Biopsies of prostate (if applicable) revealed:    Past/Anticipated interventions by urology, if any: biopsy, cystoscopy ??, CT scan of abd. Pelvis?, referral to radiation oncology  Past/Anticipated interventions by medical oncology, if any: no  Weight changes, if any: no  Bowel/Bladder complaints, if any:    Nausea/Vomiting, if any: no  Pain issues, if any:    SAFETY ISSUES:  Prior radiation?   Pacemaker/ICD?   Possible current pregnancy? no  Is the patient on methotrexate?   Current Complaints / other details:  76 year old male. Widowed with one daughter and two sons. Breast cancer-Mother. Unknown cancer - Father.

## 2017-01-19 ENCOUNTER — Telehealth: Payer: Self-pay | Admitting: Radiation Oncology

## 2017-01-19 ENCOUNTER — Ambulatory Visit: Payer: Medicare Other

## 2017-01-19 ENCOUNTER — Ambulatory Visit
Admission: RE | Admit: 2017-01-19 | Discharge: 2017-01-19 | Disposition: A | Discharge status: Home / Self Care | Payer: Medicare Other | Source: Ambulatory Visit | Attending: Radiation Oncology | Admitting: Radiation Oncology

## 2017-01-19 DIAGNOSIS — Z510 Encounter for antineoplastic radiation therapy: Secondary | ICD-10-CM | POA: Insufficient documentation

## 2017-01-19 DIAGNOSIS — C61 Malignant neoplasm of prostate: Secondary | ICD-10-CM | POA: Insufficient documentation

## 2017-01-19 DIAGNOSIS — F17200 Nicotine dependence, unspecified, uncomplicated: Secondary | ICD-10-CM | POA: Insufficient documentation

## 2017-01-19 HISTORY — DX: Malignant neoplasm of prostate: C61

## 2017-01-19 NOTE — Telephone Encounter (Signed)
Patient has not shown for appointment. Phoned to inquire. No answer at any number provided and no option to leave message.

## 2017-01-28 ENCOUNTER — Telehealth: Payer: Self-pay | Admitting: Medical Oncology

## 2017-01-28 NOTE — Telephone Encounter (Signed)
I attempted to call Todd Keith inquire about missed appointment with Dr. Tammi Klippel 01/19/17. The answering machine was not set up so I could not leave a message. I will try to reach him at a later time.

## 2017-02-10 ENCOUNTER — Ambulatory Visit: Payer: Medicare Other

## 2017-02-10 ENCOUNTER — Telehealth: Payer: Self-pay | Admitting: Medical Oncology

## 2017-02-10 ENCOUNTER — Telehealth: Payer: Self-pay | Admitting: Radiation Oncology

## 2017-02-10 ENCOUNTER — Ambulatory Visit
Admission: RE | Admit: 2017-02-10 | Discharge: 2017-02-10 | Disposition: A | Discharge status: Home / Self Care | Payer: Medicare Other | Source: Ambulatory Visit | Attending: Radiation Oncology | Admitting: Radiation Oncology

## 2017-02-10 NOTE — Telephone Encounter (Signed)
I attempted to reach Todd Keith to inquire about missed appointment with Dr. Tammi Klippel this morning. I was unable to reach and no mailbox  set up.

## 2017-02-10 NOTE — Telephone Encounter (Signed)
Patient rescheduled today for consult with Dr. Tammi Klippel. Patient did not show for consult previously. Understand the patient has transportation issues. Toniann Ket attempted yesterday to assist patient with transportation needs but was unable to reach patient via phone at any number provided after the initial conversation. Patient has yet to show this morning for consult. Phoned all phone numbers provided. No answer or option to leave message at any of them.

## 2017-03-09 ENCOUNTER — Other Ambulatory Visit: Payer: Self-pay

## 2017-03-09 ENCOUNTER — Ambulatory Visit
Admission: RE | Admit: 2017-03-09 | Discharge: 2017-03-09 | Disposition: A | Discharge status: Home / Self Care | Payer: Medicare Other | Source: Ambulatory Visit | Attending: Radiation Oncology | Admitting: Radiation Oncology

## 2017-03-09 ENCOUNTER — Encounter: Payer: Self-pay | Admitting: Radiation Oncology

## 2017-03-09 ENCOUNTER — Encounter: Payer: Self-pay | Admitting: General Practice

## 2017-03-09 VITALS — BP 109/72 | HR 67 | Temp 97.7°F | Resp 18 | Ht 71.0 in | Wt 164.0 lb

## 2017-03-09 DIAGNOSIS — Z510 Encounter for antineoplastic radiation therapy: Secondary | ICD-10-CM | POA: Diagnosis present

## 2017-03-09 DIAGNOSIS — C61 Malignant neoplasm of prostate: Secondary | ICD-10-CM | POA: Diagnosis not present

## 2017-03-09 DIAGNOSIS — F17200 Nicotine dependence, unspecified, uncomplicated: Secondary | ICD-10-CM | POA: Diagnosis not present

## 2017-03-09 NOTE — Progress Notes (Signed)
See progress note under physician encounter. 
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
## 2017-03-09 NOTE — Progress Notes (Signed)
Radiation Oncology         [phone] ________________________________  Initial Outpatient Consultation  Name: Todd Keith MRN: [id number]  Date: 03/09/2017  DOB: March 11, 1940  CC:[facility] [place]  Todd Aloe, MD   REFERRING PHYSICIAN: Festus Aloe, MD  DIAGNOSIS: 77 y.o. 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 Malignant neoplasm of prostate ([place]) [place] [facility] referral to [place] is a 77 y.o. gentleman.  He was noted to have an elevated PSA of 8.3 by his primary care provider, Todd Dienes, NP. He had a prior history of elevated PSA with a biopsy of the prostate done about 6 years ago that was benign. Accordingly, he was referred for evaluation in urology by Dr. Festus Keith on 07/21/2016, where a digital rectal examination was performed at that time revealing an asymmetric prostate (R>L) with a 5 mm nodule in the left apex of the prostate.  The patient proceeded to transrectal ultrasound with 12 biopsies of the prostate on 11/09/2016. PSA at that time was 5.91. The prostate volume measured 32.93 cc.  Out of 12 core biopsies, 3 were positive.  The maximum Gleason score was 3+4, and this was seen in the left base lateral, left mid lateral, and left mid.  Biopsies of prostate revealed:    PSA History: 08/2015 PSA 7.2 05/2016 PSA 8.3 07/2016 PSA 8.16 11/2016 PSA 5.91  CT scan of the abdomen/pelvis done on 02/09/2017 was negative. The patient reviewed these results with his urologist and he has kindly been referred today for discussion of potential radiation treatment options.  PREVIOUS RADIATION THERAPY: No  PAST MEDICAL HISTORY:  has a past medical history of COPD (chronic obstructive pulmonary disease) ([place]), Depression, GERD (gastroesophageal reflux disease), Headache(784.0), and Prostate cancer ([place]).     PAST SURGICAL HISTORY: Past Surgical History:  Procedure Laterality Date  . ANTERIOR CERVICAL DECOMP/DISCECTOMY FUSION N/A 06/05/2012   Procedure: Cervical six - seven  ANTERIOR CERVICAL DECOMPRESSION/DISCECTOMY FUSION 1 LEVEL REMOVAL OF HARDWARE Cervical four - six;  Surgeon: Charlie Pitter, MD;  Location: [facility];  Service: Neurosurgery;  Laterality: N/A;  . CERVICAL DISC SURGERY    . PROSTATE BIOPSY      FAMILY HISTORY: family history includes Cancer in his father and mother.  SOCIAL HISTORY:  reports that he has been smoking cigars.  He has smoked for the past 60.00 years. he has never used smokeless tobacco. He reports that he does not drink alcohol or use drugs.  Widowed with one daughter and two sons. Lives in [place] near [place].   ALLERGIES: Percocet [oxycodone-acetaminophen]  MEDICATIONS:  Current Outpatient Medications  Medication Sig Dispense Refill  . albuterol (PROAIR HFA) 108 (90 BASE) MCG/ACT inhaler Inhale 2 puffs into the lungs 4 (four) times daily.    . baclofen (LIORESAL) 10 MG tablet     . Fluticasone-Salmeterol (ADVAIR DISKUS) 250-50 MCG/DOSE AEPB     . hydrOXYzine (VISTARIL) 50 MG capsule     . loratadine (CLARITIN) 10 MG tablet Take 10 mg by mouth daily.    Marland Kitchen tiotropium (SPIRIVA HANDIHALER) 18 MCG inhalation capsule     . tiZANidine (ZANAFLEX) 4 MG tablet Take 4 mg by mouth 3 (three) times daily.    . DULoxetine (CYMBALTA) 60 MG capsule Take 60 mg by mouth daily.    Marland Kitchen gabapentin (NEURONTIN)  300 MG capsule Take 300 mg by mouth 2 (two) times daily.    . nortriptyline (PAMELOR) 50 MG capsule Take 50 mg by mouth at bedtime.    Marland Kitchen oxyCODONE-acetaminophen (PERCOCET/ROXICET) 5-325 MG per tablet Take 1-2 tablets by mouth every 4 (four) hours as needed. (Patient not taking: Reported on 03/09/2017) 80 tablet 0  . oxymorphone (OPANA) 10 MG tablet Take 10 mg by mouth 2 (two) times daily.     No current facility-administered medications for this encounter.     REVIEW  OF SYSTEMS:  On review of systems, the patient reports that he is doing well overall. He denies any chest pain, shortness of breath, cough, fevers, chills, night sweats, or unintended weight changes. He denies any bowel disturbances, and denies abdominal pain, nausea or vomiting. He reports constant pain in his right hip, right leg, right shoulder, and cervical spine s/p 4 neck surgeries. He denies any new skin lesions or concerns. The patient completed an IPSS and IIEF questionnaire.  His IPSS score was 3 indicating mild urinary outflow obstructive symptoms of frequency and weak stream. He denies any dysuria, hematuria, leakage or incontinence.  He indicated that he is not sexually active. A complete review of systems is obtained and is otherwise negative.    PHYSICAL EXAM: This patient is in no acute distress.  He is alert and oriented.   height is 5\' 11"  (1.803 m) and weight is 164 lb (74.4 kg). His oral temperature is 97.7 F (36.5 C). His blood pressure is 109/72 and his pulse is 67. His respiration is 18 and oxygen saturation is 96%.  He exhibits no respiratory distress or labored breathing.  He appears neurologically intact.  His mood is pleasant.  His affect is appropriate.  Please note the digital rectal exam findings described above.  KPS = 100  100 - Normal; no complaints; no evidence of disease. 90   - Able to carry on normal activity; minor signs or symptoms of disease. 80   - Normal activity with effort; some signs or symptoms of disease. 58   - Cares for self; unable to carry on normal activity or to do active work. 60   - Requires occasional assistance, but is able to care for most of his personal needs. 50   - Requires considerable assistance and frequent medical care. 66   - Disabled; requires special care and assistance. 63   - Severely disabled; hospital admission is indicated although death not imminent. 49   - Very sick; hospital admission necessary; active supportive treatment  necessary. 10   - Moribund; fatal processes progressing rapidly. 0     - Dead  Karnofsky DA, Abelmann [place], Craver LS and Burchenal [facility] [phone]) The use of the nitrogen mustards in the palliative treatment of carcinoma: with particular reference to bronchogenic carcinoma Cancer 1 634-56   LABORATORY DATA:  Lab Results  Component Value Date   WBC 10.2 05/31/2012   HGB 15.7 05/31/2012   HCT 45.9 05/31/2012   MCV 92.2 05/31/2012   PLT 290 05/31/2012   No results found for: NA, K, CL, CO2 No results found for: ALT, AST, GGT, ALKPHOS, BILITOT   RADIOGRAPHY: No results found.    IMPRESSION: This is a 77 y.o. 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  His T-Stage, Gleason's Score, and PSA put him into the intermediate risk group.  Accordingly he is eligible for a variety of potential  treatment options including brachytherapy or 8 weeks of external radiation. We discussed radiation treatment in the management of prostate cancer with regard to the logistics and delivery of external beam radiation treatment as well as the logistics and delivery of prostate brachytherapy.  We compared and contrasted each of these approaches and also compared these against prostatectomy.  The patient expressed interest in prostate brachytherapy.   PLAN: The patient would like to proceed with prostate brachytherapy.  I will share my findings with Dr. Junious Silk and move forward with scheduling the procedure in the near future.     I enjoyed meeting with him today, and will look forward to participating in the care of this very nice gentleman.  I spent time face to face with the patient and more than 50% of that time was spent in counseling and/or coordination of care.   ------------------------------------------------  Sheral Apley. Tammi Klippel, M.D.  This document serves as a record of services personally performed by Tyler Pita, MD. It was created on his  behalf by Rae Lips, a trained medical scribe. The creation of this record is based on the scribe's personal observations and the provider's statements to them. This document has been checked and approved by the attending provider.

## 2017-03-09 NOTE — Progress Notes (Signed)
[place] Psychosocial Distress Screening Clinical Social Work  Clinical Social Work was referred by distress screening protocol.  The patient scored a 10 on the Psychosocial Distress Thermometer which indicates severe distress. CSW and patient discussed common feeling and emotions when being diagnosed with cancer, and the importance of support during treatment. CSW informed patient of the support team and support services at [facility]. CSW provided contact information and encouraged patient to call with any questions or concerns.CSW spoke w Myriam Jacobson RN from his PCP of record, [facility], has not been in for care since Aug 2017.  Per patient, he now receives care from [facility] in [place] ([address] [place], [place]). Also goes to pain management clinic in [place], next appointment is 03/16/17 - could not remember name of provider.  Reports he is prescribed Opana and oxycontin.  Reports that he does not receive enough pain medications for issues w neck injury -  pain prohibits him from completing home repairs that are needed, was formerly employed as Nature conservation officer and is capable of completing his home repairs but is limited by pain.  States he has a daughter who can help w transportation - currently uses Medicaid transport and is concerned that "the bus wont wait for me" if it's "too long."  [place], no personal car.  CSW offered information on [place] for assistance w gas cards if family members transport.  Patient is awaiting call from scheduling to learn time/date of next appointments - will contact CSW if he has issues w transportation.    ONCBCN DISTRESS SCREENING 03/09/2017  Screening Type Initial Screening  Distress experienced in past week (1-10) 10  Practical problem type Housing;Transportation  Family Problem type Partner  Emotional problem type Depression;Nervousness/Anxiety;Adjusting to illness;Isolation/feeling  alone;Boredom;Adjusting to appearance changes  Information Concerns Type Lack of info about treatment  Physical Problem type Pain;Sleep/insomnia;Getting around;Bathing/dressing;Breathing;Changes in urination;Skin dry/itchy  Physician notified of physical symptoms Yes  Referral to clinical psychology No  Referral to clinical social work Yes  Referral to dietition No  Referral to financial advocate No  Referral to support programs No  Referral to palliative care No    Clinical Social Worker follow up needed: Yes.    If yes, follow up plan:  CSW to assist w referral to [facility] if pt needs help w gas cards.  Have asked RN Navigator to refer to [facility] for home assessment for home safety and other needs.  Pt to call CSW when he has information on upcoming appointments if he needs help.    Edwyna Shell, LCSW Clinical Social Worker Phone:  [phone]

## 2017-03-09 NOTE — Addendum Note (Signed)
Encounter addended by: Tyler Pita, MD on: 03/09/2017 4:18 PM  Actions taken: LOS modified, Follow-up modified, Visit Navigator Flowsheet section accepted

## 2017-03-10 ENCOUNTER — Other Ambulatory Visit: Payer: Self-pay

## 2017-03-10 ENCOUNTER — Other Ambulatory Visit: Payer: Self-pay | Admitting: Radiation Oncology

## 2017-03-10 DIAGNOSIS — C61 Malignant neoplasm of prostate: Secondary | ICD-10-CM

## 2017-03-10 NOTE — Patient Outreach (Signed)
[facility]) Care Management  03/10/2017  Todd Keith 1941/03/07 [id number]   Telephone call to patient for screening after physician referral.  No answer at patient number [phone].  Unable to leave a voicemail.  Called home number listed [phone] male states not the right number.    Plan: RN CM will attempt patient again within 3 business days.     Jone Baseman, RN, MSN [facility] Care Management Care Management Coordinator Direct Line [phone] Toll Free: [phone]  Fax: [phone]

## 2017-03-11 ENCOUNTER — Telehealth: Payer: Self-pay | Admitting: *Deleted

## 2017-03-11 ENCOUNTER — Other Ambulatory Visit: Payer: Self-pay

## 2017-03-11 ENCOUNTER — Encounter: Payer: Self-pay | Admitting: General Practice

## 2017-03-11 NOTE — Patient Outreach (Signed)
[facility]) Care Management  03/11/2017  Todd Keith Dec 06, 1940 [id number]   2nd telephone call to patient for physician referral. No answer.  Unable to leave a message.  States voice mail not set up.  Plan: RN CM will attempt patient again within 3 business days.    Jone Baseman, RN, MSN [facility] Care Management Care Management Coordinator Direct Line [phone] Toll Free: [phone]  Fax: [phone]

## 2017-03-11 NOTE — Telephone Encounter (Signed)
CALLED PATIENT TO INFORM OF PRE-SEED APPT. ON 04-01-17 @ 3 PM, NO ANSWER WILL CALL LATER

## 2017-03-11 NOTE — Patient Outreach (Signed)
[facility]) Care Management  03/11/2017  TREMON SAINVIL [birth year] [id number]   Return telephone call to patient for screening.  In basket received from [employer] as well.  No answer.  Unable to leave a voice message.  Plan: RN CM will attempt patient one more time within 3 business days.    Jone Baseman, RN, MSN [facility] Care Management Care Management Coordinator Direct Line [phone] Toll Free: [phone]  Fax: [phone]

## 2017-03-11 NOTE — Progress Notes (Signed)
[place] CSW Progress Note  Reviewed chart, noted [facility] having difficulty reaching patient.  Called patient, spoke w him and gave him contact information for [place].  Asked patient to call her, reinforced usefulness of [facility] to address needs in community.  Pt states he will call.  Edwyna Shell, LCSW Clinical Social Worker Phone:  [phone]

## 2017-03-14 ENCOUNTER — Other Ambulatory Visit: Payer: Self-pay

## 2017-03-14 NOTE — Patient Outreach (Signed)
[facility]) Care Management  03/14/2017  Todd Keith 05-Dec-1940 [id number]   Telephone call to patient for screen call.  No answer.  Unable to leave a voice message.  Plan: RN CM will send letter to attempt outreach. If no response within 10 business days will close case.   Jone Baseman, RN, MSN [facility] Care Management Care Management Coordinator Direct Line [phone] Toll Free: [phone]  Fax: [phone]

## 2017-03-15 ENCOUNTER — Encounter: Payer: Self-pay | Admitting: Medical Oncology

## 2017-03-15 ENCOUNTER — Telehealth: Payer: Self-pay | Admitting: Medical Oncology

## 2017-03-15 NOTE — Telephone Encounter (Signed)
Spoke with Todd Keith to introduce myself as the prostate nurse navigator and my role. I was unable to meet him the day he consulted with Dr. Tammi Klippel. I informed him that we have tried to contact him with pre-seed CT appointment and [facility] as attempted to reach him. He states that his phone does not always work. I encouraged him to call [facility] because they can help address him needs and resources. I gave him pre-seed appointment date and time 04/01/17 @ 3 pm. I informed him I would also mail him this information and  my business card. He voiced understanding of the above.

## 2017-03-22 ENCOUNTER — Telehealth: Payer: Self-pay | Admitting: *Deleted

## 2017-03-22 NOTE — Telephone Encounter (Signed)
Called patient to inform of pre-seed appt. for 04-01-17, spoke with patient and he is aware of this appt.

## 2017-03-25 ENCOUNTER — Ambulatory Visit: Payer: Medicare Other | Admitting: Oncology

## 2017-03-28 ENCOUNTER — Other Ambulatory Visit: Payer: Self-pay

## 2017-03-28 NOTE — Patient Outreach (Signed)
[facility]) Care Management  03/28/2017  Todd Keith October 10, 1940 [id number]   Multiple attempts to establish contact with patient without success. No response from letter mailed to patient. Case is being closed at this time.   Plan: RN CM will notify [facility] administrative assistant of case status.  Jone Baseman, RN, MSN [facility] Care Management Care Management Coordinator Direct Line [phone] Toll Free: [phone]  Fax: [phone]

## 2017-03-31 ENCOUNTER — Telehealth: Payer: Self-pay | Admitting: *Deleted

## 2017-03-31 NOTE — Telephone Encounter (Signed)
CALLED PATIENT TO REMIND OF PRE-SEED APPT. FOR 04-01-17, NO ANSWER WILL CALL LATER.

## 2017-04-01 ENCOUNTER — Other Ambulatory Visit: Payer: Self-pay | Admitting: Urology

## 2017-04-01 ENCOUNTER — Ambulatory Visit
Admission: RE | Admit: 2017-04-01 | Discharge: 2017-04-01 | Disposition: A | Discharge status: Home / Self Care | Payer: Medicare Other | Source: Ambulatory Visit | Attending: Radiation Oncology | Admitting: Radiation Oncology

## 2017-04-01 DIAGNOSIS — Z510 Encounter for antineoplastic radiation therapy: Secondary | ICD-10-CM | POA: Diagnosis not present

## 2017-04-01 DIAGNOSIS — C61 Malignant neoplasm of prostate: Secondary | ICD-10-CM

## 2017-04-01 NOTE — Progress Notes (Signed)
  Radiation Oncology         (336) [phone] ________________________________  Name: Todd Keith MRN: [id number]  Date: 04/01/2017  DOB: [birth year]  SIMULATION AND TREATMENT PLANNING NOTE PUBIC ARCH STUDY  CC:[facility] [place]  [place], [place]man [place], MD  DIAGNOSIS: 77 y.o. 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 Malignant neoplasm of prostate ([place]) C61     COMPLEX SIMULATION:  The patient presented today for evaluation for possible prostate seed implant. He was brought to the radiation planning suite and placed supine on the CT couch. A 3-dimensional image study set was obtained in upload to the planning computer. There, on each axial slice, I contoured the prostate gland. Then, using three-dimensional radiation planning tools I reconstructed the prostate in view of the structures from the transperineal needle pathway to assess for possible pubic arch interference. In doing so, I did not appreciate any pubic arch interference. Also, the patient's prostate volume was estimated based on the drawn structure. The volume was 35 cc.  Given the pubic arch appearance and prostate volume, patient remains a good candidate to proceed with prostate seed implant. Today, he freely provided informed written consent to proceed.    PLAN: The patient will undergo definitive prostate seed implant to 145 Gy.   ________________________________  Sheral Apley. Tammi Klippel, M.D.

## 2017-04-05 ENCOUNTER — Encounter: Payer: Self-pay | Admitting: Medical Oncology

## 2017-04-11 ENCOUNTER — Telehealth: Payer: Self-pay | Admitting: *Deleted

## 2017-04-11 NOTE — Telephone Encounter (Signed)
CALLED PATIENT TO INFORM OF DATE FOR CHEST X-RAY AND EKG - 04-29-17 @ 1 PM AND HIS IMPLANT AND SPACE OAR ON 06-07-17 @ 7:30 AM, NO ANSWER WILL CALL LATER

## 2017-04-20 ENCOUNTER — Other Ambulatory Visit: Payer: Self-pay | Admitting: Urology

## 2017-04-26 ENCOUNTER — Other Ambulatory Visit: Payer: Self-pay | Admitting: Urology

## 2017-04-26 DIAGNOSIS — C61 Malignant neoplasm of prostate: Secondary | ICD-10-CM

## 2017-04-28 ENCOUNTER — Telehealth: Payer: Self-pay | Admitting: *Deleted

## 2017-04-28 NOTE — Telephone Encounter (Signed)
CALLED PATIENT TO REMIND OF CHEST X-RAY AND EKG, NO ANSWER

## 2017-04-29 ENCOUNTER — Encounter (HOSPITAL_COMMUNITY)
Admission: RE | Admit: 2017-04-29 | Discharge: 2017-04-29 | Disposition: A | Discharge status: Home / Self Care | Payer: Medicare Other | Source: Ambulatory Visit | Attending: Urology | Admitting: Urology

## 2017-04-29 ENCOUNTER — Ambulatory Visit (HOSPITAL_COMMUNITY)
Admission: RE | Admit: 2017-04-29 | Discharge: 2017-04-29 | Disposition: A | Discharge status: Home / Self Care | Payer: Medicare Other | Source: Ambulatory Visit | Attending: Urology | Admitting: Urology

## 2017-04-29 DIAGNOSIS — I493 Ventricular premature depolarization: Secondary | ICD-10-CM | POA: Insufficient documentation

## 2017-04-29 DIAGNOSIS — C61 Malignant neoplasm of prostate: Secondary | ICD-10-CM | POA: Diagnosis present

## 2017-04-29 DIAGNOSIS — I700 Atherosclerosis of aorta: Secondary | ICD-10-CM | POA: Insufficient documentation

## 2017-05-26 ENCOUNTER — Encounter (HOSPITAL_BASED_OUTPATIENT_CLINIC_OR_DEPARTMENT_OTHER): Payer: Self-pay

## 2017-06-07 ENCOUNTER — Ambulatory Visit (HOSPITAL_BASED_OUTPATIENT_CLINIC_OR_DEPARTMENT_OTHER): Admission: RE | Admit: 2017-06-07 | Payer: Medicare Other | Source: Ambulatory Visit | Admitting: Urology

## 2017-06-07 ENCOUNTER — Encounter (HOSPITAL_BASED_OUTPATIENT_CLINIC_OR_DEPARTMENT_OTHER): Admission: RE | Payer: Self-pay | Source: Ambulatory Visit

## 2017-06-07 SURGERY — INSERTION, RADIATION SOURCE, PROSTATE
Anesthesia: General

## 2017-06-27 ENCOUNTER — Telehealth: Payer: Self-pay | Admitting: *Deleted

## 2017-06-27 ENCOUNTER — Encounter (HOSPITAL_BASED_OUTPATIENT_CLINIC_OR_DEPARTMENT_OTHER): Payer: Self-pay | Admitting: *Deleted

## 2017-06-27 NOTE — Telephone Encounter (Signed)
CALLED PATIENT TO INFORM OF LAB APPT., NO ANSWER WILL CALL LATER

## 2017-06-28 ENCOUNTER — Encounter (HOSPITAL_COMMUNITY): Admission: RE | Admit: 2017-06-28 | Payer: Medicare Other | Source: Ambulatory Visit

## 2017-06-28 NOTE — Progress Notes (Addendum)
Tried calling pt yesterday , unable to reach pt.  Called and spoke w/ pt daughter, Todd Keith, she stated pt phone does not pick up good in his home and he does not have a car and would transportation.  Pt lm on my voicemail at 0800 today.  Called pt back via phone.  Pt stated he called the transportation person that has been helping and was told he had to give 5 days notice, or he had to have facility call them.  Will call and get cancer center social worker to call pt/ transportation for lab work scheduled at [facility] main Friday 07-01-2017 @0900  and surgery scheduled for Tuesday 07-05-2017 @0730  Called and lvm for [employer] social worker at cancer center about pt issue.  Addedum:  Called and lvm again for any cancer center social worker to help pt with transportation issues. Ask for call back.

## 2017-06-29 ENCOUNTER — Encounter: Payer: Self-pay | Admitting: *Deleted

## 2017-06-29 ENCOUNTER — Encounter: Payer: Self-pay | Admitting: General Practice

## 2017-06-29 NOTE — Progress Notes (Signed)
[place] Clinical Social Work  Clinical Social Work was referred by Jeral Fruit, RN, for transportation assistance.  CSW attempted to contact patient by phone (he does not have voicemail set up) and attempted to contact patient's daughter (left voicemail).    Maryjean Morn, MSW, LCSW, OSW-C Clinical Social Worker [facility] [phone]

## 2017-06-29 NOTE — Progress Notes (Signed)
[place] CSW Progress Notes  Call received from scheduling nurse, D Sexton RN, stating that patient is scheduled for procedure and has no transportation.  Patient lives in [place] and undersigned have both attempted to contact patient.  No answer and VM is not set up.  Patient has [facility], is eligible for Medicaid transport which can provide out of county transport if scheduled in advance.  Medicaid worker is Ayesha Mohair [id number]); left VM w worker and requested call back.  Spoke w CSW Avis Epley at [place] can do welfare check [phone]).  States patient's residence may not have any cell phone coverage so patient will not get messages. Medicaid transport must be scheduled 72 hours in advance of need.  Information given to CSW Wallis Bamberg to decide action on case.  Edwyna Shell, LCSW Clinical Social Worker Phone:  [phone]

## 2017-06-30 ENCOUNTER — Encounter: Payer: Self-pay | Admitting: *Deleted

## 2017-06-30 ENCOUNTER — Other Ambulatory Visit: Payer: Self-pay

## 2017-06-30 ENCOUNTER — Encounter (HOSPITAL_BASED_OUTPATIENT_CLINIC_OR_DEPARTMENT_OTHER): Payer: Self-pay | Admitting: *Deleted

## 2017-06-30 NOTE — Progress Notes (Signed)
[place] Clinical Social Work  Clinical Social Work in contact with Jeral Fruit, RN, regarding transportation to appointment on 4/30.  Patient has ride scheduled through medicaid transport [phone]) ([facility]) for 4/30 at 430 am.  CSW unable to reach patient after multiple attempts.  CSW requested welfare check visit from [facility] office- they made visit and reported patient is well and aware he needs to contact [facility] team.  CSW will follow up with Jeral Fruit, RN, to communicate findings.   Maryjean Morn, MSW, LCSW, OSW-C Clinical Social Worker [facility] [phone]

## 2017-06-30 NOTE — Progress Notes (Signed)
AFTER MULTIPLE ATTEMPTS AT CONTACTING PT VIA PHONE, I DID SPEAK W/ PT TODAY FOR PRE-OP INTERVIEW AND INFORM HIM OF TRANSPORTATION ARRANGEMENT.   PT VERBALIZED UNDERSTANDING TO ARRIVE AT 0530 DOS AND BE NPO AFTER MN W/ EXCEPTION SIPS OF WATER DOS WITH Duanne Moron AND DO ADVAIR/ SPIRIVA INHALERS.  ALSO UNDERSTOOD TO DO ONE FLEET ENEMA AM DOS.    CURRENT EKG AND CXR IN CHART AND Epic.  PT NEED ON ARRIVAL LAB WORK DONE SINCE HAS NO TRANSPORTATION PRIOR TO DOS (CBC,CMET,PT/INR,PTT). WHILE SPEAKING WITH PT , [place] [employer] SHER[employer] CAME FOR WELFARE VISIT , REQUESTED BY LAUREN SOMERS (CLINICAL SOCIAL WORKER WITH CANCER CENTER), SINCE UNABLE TO REACH PT VIA PHONE AND HE DOES NOT HAVE VOICEMAIL.   PT VERBALIZED UNDERSTANDING THAT TRANSPORTATION HAS BEEN ARRANGED W/ [facility] , HE WILL BE PICKED UP AT 0430 AND WILL TRANSPORT AT DISCHARGE. I HAVE CALLED AND SPOKE W/ CONI, OR SCHEDULER FOR DR ESKRIDGE, TO LET HIM KNOW LABS TO BE DONE DOS. ALSO, LVM WITH [facility], WITH DR MANNING, TO LET HIM KNOW ABOUT LABS SAME DAY AND THAT PT HAS TRANSPORTATION.  ALSO, CALLED LVM FOR LAUREN SOMERS, SW, THAT I DID SPEAK W/ PT TODAY , HE IS AWARE ABOUT TRANSPORT COMING DOS AT 0430.

## 2017-07-01 ENCOUNTER — Other Ambulatory Visit (HOSPITAL_COMMUNITY): Payer: Medicare Other

## 2017-07-04 ENCOUNTER — Telehealth: Payer: Self-pay | Admitting: *Deleted

## 2017-07-04 NOTE — Telephone Encounter (Signed)
CALLED PATIENT TO REMIND OF PROCEDURE FOR 07-05-17, NO ANSWER

## 2017-07-04 NOTE — Anesthesia Preprocedure Evaluation (Addendum)
Anesthesia Evaluation  Patient identified by MRN, date of birth, ID band Patient awake    Reviewed: Allergy & Precautions, NPO status , Patient's Chart, lab work & pertinent test results  Airway Mallampati: II  TM Distance: >3 FB Neck ROM: Full    Dental  (+) Edentulous Upper, Edentulous Lower   Pulmonary shortness of breath and with exertion, sleep apnea , COPD,  COPD inhaler, Current Smoker,    Pulmonary exam normal breath sounds clear to auscultation       Cardiovascular negative cardio ROS Normal cardiovascular exam Rhythm:Regular Rate:Normal  ECG: SR, freq PVC's, rate 72   Neuro/Psych  Headaches, PSYCHIATRIC DISORDERS Anxiety Depression  Neuromuscular disease    GI/Hepatic Neg liver ROS, GERD  Medicated and Controlled,  Endo/Other  negative endocrine ROS  Renal/GU negative Renal ROS     Musculoskeletal negative musculoskeletal ROS (+)   Abdominal   Peds  Hematology negative hematology ROS (+)   Anesthesia Other Findings PROSTATE CANCER  Reproductive/Obstetrics                           Anesthesia Physical Anesthesia Plan  ASA: III  Anesthesia Plan: General   Post-op Pain Management:    Induction: Intravenous  PONV Risk Score and Plan: 1 and Ondansetron, Dexamethasone and Treatment may vary due to age or medical condition  Airway Management Planned: LMA  Additional Equipment:   Intra-op Plan:   Post-operative Plan: Extubation in OR  Informed Consent: I have reviewed the patients History and Physical, chart, labs and discussed the procedure including the risks, benefits and alternatives for the proposed anesthesia with the patient or authorized representative who has indicated his/her understanding and acceptance.   Dental advisory given  Plan Discussed with: CRNA  Anesthesia Plan Comments:         Anesthesia Quick Evaluation

## 2017-07-05 ENCOUNTER — Observation Stay (HOSPITAL_BASED_OUTPATIENT_CLINIC_OR_DEPARTMENT_OTHER)
Admission: RE | Admit: 2017-07-05 | Discharge: 2017-07-05 | Disposition: A | Discharge status: Home / Self Care | Payer: Medicare Other | Source: Ambulatory Visit | Attending: Urology | Admitting: Urology

## 2017-07-05 ENCOUNTER — Encounter (HOSPITAL_BASED_OUTPATIENT_CLINIC_OR_DEPARTMENT_OTHER)
Admission: RE | Disposition: A | Discharge status: Home / Self Care | Payer: Self-pay | Source: Ambulatory Visit | Attending: Urology

## 2017-07-05 ENCOUNTER — Ambulatory Visit (HOSPITAL_BASED_OUTPATIENT_CLINIC_OR_DEPARTMENT_OTHER): Payer: Medicare Other | Admitting: Anesthesiology

## 2017-07-05 ENCOUNTER — Encounter: Payer: Self-pay | Admitting: Medical Oncology

## 2017-07-05 ENCOUNTER — Ambulatory Visit (HOSPITAL_COMMUNITY): Payer: Medicare Other

## 2017-07-05 ENCOUNTER — Other Ambulatory Visit: Payer: Self-pay

## 2017-07-05 ENCOUNTER — Encounter (HOSPITAL_BASED_OUTPATIENT_CLINIC_OR_DEPARTMENT_OTHER): Payer: Self-pay

## 2017-07-05 DIAGNOSIS — Z803 Family history of malignant neoplasm of breast: Secondary | ICD-10-CM | POA: Diagnosis not present

## 2017-07-05 DIAGNOSIS — R351 Nocturia: Secondary | ICD-10-CM | POA: Insufficient documentation

## 2017-07-05 DIAGNOSIS — F329 Major depressive disorder, single episode, unspecified: Secondary | ICD-10-CM | POA: Insufficient documentation

## 2017-07-05 DIAGNOSIS — J449 Chronic obstructive pulmonary disease, unspecified: Secondary | ICD-10-CM | POA: Insufficient documentation

## 2017-07-05 DIAGNOSIS — Z7951 Long term (current) use of inhaled steroids: Secondary | ICD-10-CM | POA: Insufficient documentation

## 2017-07-05 DIAGNOSIS — F418 Other specified anxiety disorders: Secondary | ICD-10-CM | POA: Insufficient documentation

## 2017-07-05 DIAGNOSIS — Z809 Family history of malignant neoplasm, unspecified: Secondary | ICD-10-CM | POA: Diagnosis not present

## 2017-07-05 DIAGNOSIS — I493 Ventricular premature depolarization: Secondary | ICD-10-CM | POA: Insufficient documentation

## 2017-07-05 DIAGNOSIS — G629 Polyneuropathy, unspecified: Secondary | ICD-10-CM | POA: Diagnosis not present

## 2017-07-05 DIAGNOSIS — Z9842 Cataract extraction status, left eye: Secondary | ICD-10-CM | POA: Diagnosis not present

## 2017-07-05 DIAGNOSIS — Z79899 Other long term (current) drug therapy: Secondary | ICD-10-CM | POA: Insufficient documentation

## 2017-07-05 DIAGNOSIS — G4730 Sleep apnea, unspecified: Secondary | ICD-10-CM | POA: Insufficient documentation

## 2017-07-05 DIAGNOSIS — C801 Malignant (primary) neoplasm, unspecified: Principal | ICD-10-CM | POA: Insufficient documentation

## 2017-07-05 DIAGNOSIS — K219 Gastro-esophageal reflux disease without esophagitis: Secondary | ICD-10-CM | POA: Diagnosis not present

## 2017-07-05 DIAGNOSIS — Z981 Arthrodesis status: Secondary | ICD-10-CM | POA: Diagnosis not present

## 2017-07-05 DIAGNOSIS — F419 Anxiety disorder, unspecified: Secondary | ICD-10-CM | POA: Diagnosis present

## 2017-07-05 DIAGNOSIS — M1990 Unspecified osteoarthritis, unspecified site: Secondary | ICD-10-CM | POA: Diagnosis present

## 2017-07-05 DIAGNOSIS — R350 Frequency of micturition: Secondary | ICD-10-CM | POA: Insufficient documentation

## 2017-07-05 DIAGNOSIS — C61 Malignant neoplasm of prostate: Secondary | ICD-10-CM | POA: Diagnosis present

## 2017-07-05 DIAGNOSIS — R51 Headache: Secondary | ICD-10-CM | POA: Insufficient documentation

## 2017-07-05 DIAGNOSIS — Z885 Allergy status to narcotic agent status: Secondary | ICD-10-CM | POA: Diagnosis not present

## 2017-07-05 DIAGNOSIS — F17290 Nicotine dependence, other tobacco product, uncomplicated: Secondary | ICD-10-CM | POA: Insufficient documentation

## 2017-07-05 HISTORY — DX: Other vitreous opacities, left eye: H43.392

## 2017-07-05 HISTORY — DX: Complete loss of teeth, unspecified cause, unspecified class: K08.109

## 2017-07-05 HISTORY — DX: Sleep apnea, unspecified: G47.30

## 2017-07-05 HISTORY — DX: Other forms of dyspnea: R06.09

## 2017-07-05 HISTORY — DX: Personal history of other diseases of the nervous system and sense organs: Z86.69

## 2017-07-05 HISTORY — DX: Presence of spectacles and contact lenses: Z97.3

## 2017-07-05 HISTORY — DX: Anodontia: K00.0

## 2017-07-05 HISTORY — PX: RADIOACTIVE SEED IMPLANT: SHX5150

## 2017-07-05 HISTORY — PX: SPACE OAR INSTILLATION: SHX6769

## 2017-07-05 HISTORY — DX: Nocturia: R35.1

## 2017-07-05 HISTORY — DX: Dyspnea, unspecified: R06.00

## 2017-07-05 HISTORY — DX: Anxiety disorder, unspecified: F41.9

## 2017-07-05 HISTORY — DX: Polyneuropathy, unspecified: G62.9

## 2017-07-05 HISTORY — DX: Unspecified osteoarthritis, unspecified site: M19.90

## 2017-07-05 LAB — COMPREHENSIVE METABOLIC PANEL
ALBUMIN: 4.1 g/dL (ref 3.5–5.0)
ALK PHOS: 73 U/L (ref 38–126)
ALT: 20 U/L (ref 17–63)
ANION GAP: 11 (ref 5–15)
AST: 25 U/L (ref 15–41)
BUN: 20 mg/dL (ref 6–20)
CALCIUM: 9.3 mg/dL (ref 8.9–10.3)
CHLORIDE: 104 mmol/L (ref 101–111)
CO2: 24 mmol/L (ref 22–32)
CREATININE: 1.3 mg/dL — AB (ref 0.61–1.24)
GFR calc Af Amer: 59 mL/min — ABNORMAL LOW (ref >60)
GFR calc non Af Amer: 51 mL/min — ABNORMAL LOW (ref >60)
GLUCOSE: 108 mg/dL — AB (ref 65–99)
Potassium: 3.8 mmol/L (ref 3.5–5.1)
SODIUM: 139 mmol/L (ref 135–145)
Total Bilirubin: 0.4 mg/dL (ref 0.3–1.2)
Total Protein: 8.1 g/dL (ref 6.5–8.1)

## 2017-07-05 LAB — CBC
HCT: 48 % (ref 39.0–52.0)
HEMOGLOBIN: 15.9 g/dL (ref 13.0–17.0)
MCH: 31.5 pg (ref 26.0–34.0)
MCHC: 33.1 g/dL (ref 30.0–36.0)
MCV: 95.2 fL (ref 78.0–100.0)
Platelets: 276 10*3/uL (ref 150–400)
RBC: 5.04 MIL/uL (ref 4.22–5.81)
RDW: 14.6 % (ref 11.5–15.5)
WBC: 10 10*3/uL (ref 4.0–10.5)

## 2017-07-05 LAB — APTT: APTT: 28 s (ref 24–36)

## 2017-07-05 LAB — PROTIME-INR
INR: 0.94
Prothrombin Time: 12.5 seconds (ref 11.4–15.2)

## 2017-07-05 SURGERY — INSERTION, RADIATION SOURCE, PROSTATE
Anesthesia: General | Site: Rectum

## 2017-07-05 MED ORDER — CIPROFLOXACIN IN D5W 400 MG/200ML IV SOLN
INTRAVENOUS | Status: AC
  Filled 2017-07-05: qty 200

## 2017-07-05 MED ORDER — FENTANYL CITRATE (PF) 100 MCG/2ML IJ SOLN
INTRAMUSCULAR | Status: AC
  Filled 2017-07-05: qty 2

## 2017-07-05 MED ORDER — MIDAZOLAM HCL 2 MG/2ML IJ SOLN
INTRAMUSCULAR | Status: AC
  Filled 2017-07-05: qty 2

## 2017-07-05 MED ORDER — FENTANYL CITRATE (PF) 100 MCG/2ML IJ SOLN
25.0000 ug | INTRAMUSCULAR | Status: DC | PRN
  Administered 2017-07-05 (×2): 25 ug via INTRAVENOUS
  Filled 2017-07-05: qty 1

## 2017-07-05 MED ORDER — ONDANSETRON HCL 4 MG/2ML IJ SOLN
INTRAMUSCULAR | Status: DC | PRN
  Administered 2017-07-05: 4 mg via INTRAVENOUS

## 2017-07-05 MED ORDER — LACTATED RINGERS IV SOLN
INTRAVENOUS | Status: DC
  Administered 2017-07-05 (×2): via INTRAVENOUS
  Filled 2017-07-05: qty 1000

## 2017-07-05 MED ORDER — DEXAMETHASONE SODIUM PHOSPHATE 10 MG/ML IJ SOLN
INTRAMUSCULAR | Status: DC | PRN
  Administered 2017-07-05: 10 mg via INTRAVENOUS

## 2017-07-05 MED ORDER — TRAMADOL HCL 50 MG PO TABS: 50.0000 mg | ORAL_TABLET | Freq: Four times a day (QID) | ORAL | 0 refills | Status: DC | PRN

## 2017-07-05 MED ORDER — PROPOFOL 10 MG/ML IV BOLUS
INTRAVENOUS | Status: AC
  Filled 2017-07-05: qty 20

## 2017-07-05 MED ORDER — PHENYLEPHRINE HCL 10 MG/ML IJ SOLN
INTRAMUSCULAR | Status: AC
  Filled 2017-07-05: qty 2

## 2017-07-05 MED ORDER — PHENYLEPHRINE HCL 10 MG/ML IJ SOLN
INTRAVENOUS | Status: DC | PRN
  Administered 2017-07-05: 40 ug/min via INTRAVENOUS

## 2017-07-05 MED ORDER — CEPHALEXIN 500 MG PO CAPS: 500.0000 mg | ORAL_CAPSULE | Freq: Four times a day (QID) | ORAL | 0 refills | Status: DC

## 2017-07-05 MED ORDER — ONDANSETRON HCL 4 MG/2ML IJ SOLN
INTRAMUSCULAR | Status: AC
  Filled 2017-07-05: qty 2

## 2017-07-05 MED ORDER — CEPHALEXIN 500 MG PO CAPS: 500.0000 mg | ORAL_CAPSULE | Freq: Two times a day (BID) | ORAL | 0 refills | Status: DC

## 2017-07-05 MED ORDER — CIPROFLOXACIN IN D5W 400 MG/200ML IV SOLN
400.0000 mg | INTRAVENOUS | Status: AC
  Administered 2017-07-05: 400 mg via INTRAVENOUS
  Filled 2017-07-05: qty 200

## 2017-07-05 MED ORDER — FLEET ENEMA 7-19 GM/118ML RE ENEM
1.0000 | ENEMA | Freq: Once | RECTAL | Status: DC
Start: 2017-07-06 — End: 2017-07-05
  Filled 2017-07-05: qty 1

## 2017-07-05 MED ORDER — DEXAMETHASONE SODIUM PHOSPHATE 10 MG/ML IJ SOLN
INTRAMUSCULAR | Status: AC
Start: 2017-07-05 — End: 2017-07-05
  Filled 2017-07-05: qty 1

## 2017-07-05 MED ORDER — PHENYLEPHRINE 40 MCG/ML (10ML) SYRINGE FOR IV PUSH (FOR BLOOD PRESSURE SUPPORT)
PREFILLED_SYRINGE | INTRAVENOUS | Status: DC | PRN
  Administered 2017-07-05 (×3): 80 ug via INTRAVENOUS

## 2017-07-05 MED ORDER — IOHEXOL 300 MG/ML  SOLN
INTRAMUSCULAR | Status: DC | PRN
  Administered 2017-07-05: 7 mL

## 2017-07-05 MED ORDER — PROPOFOL 10 MG/ML IV BOLUS
INTRAVENOUS | Status: DC | PRN
  Administered 2017-07-05: 1130 mg via INTRAVENOUS

## 2017-07-05 MED ORDER — PHENYLEPHRINE 40 MCG/ML (10ML) SYRINGE FOR IV PUSH (FOR BLOOD PRESSURE SUPPORT)
PREFILLED_SYRINGE | INTRAVENOUS | Status: AC
  Filled 2017-07-05: qty 10

## 2017-07-05 MED ORDER — LIDOCAINE 2% (20 MG/ML) 5 ML SYRINGE
INTRAMUSCULAR | Status: DC | PRN
  Administered 2017-07-05: 60 mg via INTRAVENOUS

## 2017-07-05 MED ORDER — ONDANSETRON HCL 4 MG/2ML IJ SOLN
4.0000 mg | Freq: Once | INTRAMUSCULAR | Status: DC | PRN
  Filled 2017-07-05: qty 2

## 2017-07-05 MED ORDER — FENTANYL CITRATE (PF) 100 MCG/2ML IJ SOLN
INTRAMUSCULAR | Status: DC | PRN
  Administered 2017-07-05 (×2): 25 ug via INTRAVENOUS
  Administered 2017-07-05: 50 ug via INTRAVENOUS
  Administered 2017-07-05 (×4): 25 ug via INTRAVENOUS

## 2017-07-05 MED ORDER — LIDOCAINE 2% (20 MG/ML) 5 ML SYRINGE
INTRAMUSCULAR | Status: AC
  Filled 2017-07-05: qty 5

## 2017-07-05 MED ORDER — SODIUM CHLORIDE 0.9 % IV SOLN
INTRAVENOUS | Status: AC | PRN
  Administered 2017-07-05: 1000 mL

## 2017-07-05 MED ORDER — SODIUM CHLORIDE BACTERIOSTATIC 0.9 % IJ SOLN
INTRAMUSCULAR | Status: DC | PRN
  Administered 2017-07-05: 10 mL via INTRAMUSCULAR

## 2017-07-05 MED FILL — CEPHALEXIN 500 MG CAPSULE: 500 | 5 days supply | Qty: 10 | Fill #0

## 2017-07-05 MED FILL — traMADol HCL 50 MG TABS: 50 | 3 days supply | Qty: 12 | Fill #0

## 2017-07-05 SURGICAL SUPPLY — 34 items
BAG URINE DRAINAGE (UROLOGICAL SUPPLIES) ×4 IMPLANT
BLADE CLIPPER SURG (BLADE) ×4 IMPLANT
CATH FOLEY 2WAY SLVR  5CC 16FR (CATHETERS) ×2
CATH FOLEY 2WAY SLVR 5CC 16FR (CATHETERS) ×2 IMPLANT
CATH ROBINSON RED A/P 16FR (CATHETERS) IMPLANT
CATH ROBINSON RED A/P 20FR (CATHETERS) ×4 IMPLANT
CLOTH BEACON ORANGE TIMEOUT ST (SAFETY) ×4 IMPLANT
COVER BACK TABLE 60X90IN (DRAPES) ×4 IMPLANT
COVER MAYO STAND STRL (DRAPES) ×4 IMPLANT
DRSG TEGADERM 4X4.75 (GAUZE/BANDAGES/DRESSINGS) ×4 IMPLANT
DRSG TEGADERM 8X12 (GAUZE/BANDAGES/DRESSINGS) ×4 IMPLANT
GAUZE SPONGE 4X4 12PLY STRL (GAUZE/BANDAGES/DRESSINGS) ×4 IMPLANT
GLOVE BIO SURGEON STRL SZ7.5 (GLOVE) ×8 IMPLANT
GLOVE ECLIPSE 8.0 STRL XLNG CF (GLOVE) ×4 IMPLANT
GOWN STRL REUS W/ TWL LRG LVL3 (GOWN DISPOSABLE) ×2 IMPLANT
GOWN STRL REUS W/ TWL XL LVL3 (GOWN DISPOSABLE) ×2 IMPLANT
GOWN STRL REUS W/TWL LRG LVL3 (GOWN DISPOSABLE) ×2
GOWN STRL REUS W/TWL XL LVL3 (GOWN DISPOSABLE) ×6 IMPLANT
HOLDER FOLEY CATH W/STRAP (MISCELLANEOUS) ×4 IMPLANT
IMPL SPACEOAR SYSTEM 10ML (MISCELLANEOUS) ×2 IMPLANT
IMPLANT SPACEOAR SYSTEM 10ML (MISCELLANEOUS) ×4
IV NS 1000ML (IV SOLUTION) ×2
IV NS 1000ML BAXH (IV SOLUTION) ×2 IMPLANT
KIT TURNOVER CYSTO (KITS) ×4 IMPLANT
MANIFOLD NEPTUNE II (INSTRUMENTS) IMPLANT
PACK CYSTO (CUSTOM PROCEDURE TRAY) ×4 IMPLANT
SURGILUBE 2OZ TUBE FLIPTOP (MISCELLANEOUS) ×4 IMPLANT
SUT BONE WAX W31G (SUTURE) ×4 IMPLANT
SYRINGE 10CC LL (SYRINGE) ×4 IMPLANT
SelectSeed I-125 ×4 IMPLANT
TUBE CONNECTING 12'X1/4 (SUCTIONS)
TUBE CONNECTING 12X1/4 (SUCTIONS) IMPLANT
UNDERPAD 30X30 (UNDERPADS AND DIAPERS) ×8 IMPLANT
WATER STERILE IRR 500ML POUR (IV SOLUTION) ×4 IMPLANT

## 2017-07-05 NOTE — Anesthesia Procedure Notes (Signed)
Procedure Name: LMA Insertion Date/Time: 07/05/2017 7:48 AM Performed by: Suan Halter, CRNA Pre-anesthesia Checklist: Patient identified, Emergency Drugs available, Suction available and Patient being monitored Patient Re-evaluated:Patient Re-evaluated prior to induction Oxygen Delivery Method: Circle system utilized Preoxygenation: Pre-oxygenation with 100% oxygen Induction Type: IV induction Ventilation: Mask ventilation without difficulty LMA: LMA inserted LMA Size: 5.0 Number of attempts: 1 Airway Equipment and Method: Bite block Placement Confirmation: positive ETCO2 Tube secured with: Tape Dental Injury: Teeth and Oropharynx as per pre-operative assessment

## 2017-07-05 NOTE — Transfer of Care (Signed)
Immediate Anesthesia Transfer of Care Note  Patient: Todd Keith  Procedure(s) Performed: Procedure(s) (LRB): RADIOACTIVE SEED IMPLANT/BRACHYTHERAPY IMPLANT (N/A) SPACE OAR INSTILLATION (N/A)  Patient Location: PACU  Anesthesia Type: General  Level of Consciousness: awake, oriented, sedated and patient cooperative  Airway & Oxygen Therapy: Patient Spontanous Breathing and Patient connected to face mask oxygen  Post-op Assessment: Report given to PACU RN and Post -op Vital signs reviewed and stable  Post vital signs: Reviewed and stable  Complications: No apparent anesthesia complications  Last Vitals:  Vitals Value Taken Time  BP 118/75 07/05/2017  9:32 AM  Temp    Pulse 72 07/05/2017  9:33 AM  Resp 12 07/05/2017  9:33 AM  SpO2 93 % 07/05/2017  9:33 AM  Vitals shown include unvalidated device data.  Last Pain:  Vitals:   07/05/17 0652  TempSrc:   PainSc: 8

## 2017-07-05 NOTE — Progress Notes (Signed)
Dr. Junious Silk in to see. Called daughter Lattie Haw) for ride home, to review post operative instructions and ability to stay with father overnight.

## 2017-07-05 NOTE — H&P (Signed)
H&P  Chief Complaint: prostate cancer  History of Present Illness:  77 yo WM with intermediate risk prostate cancer (Gleason 3+4 = 7 and 3 cores on the left, 50 to 60%), T2-weighted with a left nodule, 33 g prostate with a PSA in the 5-8 range.  He presents for biodegradable spacer gel and brachytherapy.   Pt c/o frequency due to coffee, tea and water consumption. No nocturia. Denies EtOH. He is a smoker and had a negative hematuria eval. His IPSS score was 3 indicating mild urinary outflow obstructive symptoms of frequency and weak stream. He denies any dysuria, hematuria, leakage or incontinence. He indicated that he is not sexually active.     Past Medical History:  Diagnosis Date  . Anxiety   . Arthritis    hips and back  . COPD (chronic obstructive pulmonary disease) ([place])   . Depression   . Dyspnea on exertion   . Floater, vitreous, left    post op cataract surgery 2017  . GERD (gastroesophageal reflux disease)   . Headache(784.0)   . History of retinal hemorrhage    post op cataract surgery left eye 2017 approx.,  resolved  . Mild sleep apnea    per pt had sleep study approx. was told had mild osa and no cpap recommended  . Neuropathy, peripheral    feet and up legs  . 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 Nocturia   . Prostate cancer [facility]) urologist-  dr Camil Hausmann/  oncologist-  dr Tammi Klippel   dx 11-09-2016  Stage T2a, Gleason 3+4, PSA 5.91, vol 32.93g-- scheduled for radioactive seed implants  . Wears glasses    Past Surgical History:  Procedure Laterality Date  . ANTERIOR CERVICAL DECOMP/DISCECTOMY FUSION N/A 06/05/2012   Procedure: Cervical six - seven  ANTERIOR CERVICAL DECOMPRESSION/DISCECTOMY FUSION 1 LEVEL REMOVAL OF HARDWARE Cervical four - six;  Surgeon: Charlie Pitter, MD;  Location: [place] [facility];  Service: Neurosurgery;  Laterality: N/A;  . CATARACT EXTRACTION W/ INTRAOCULAR LENS IMPLANT Left 2017  approx.  . CERVICAL DISC SURGERY  1990s and 2004  approx.   C4-6  . PROSTATE  BIOPSY  11-09-2016   dr Aruna Nestler office    Home Medications:  Medications Prior to Admission  Medication Sig Dispense Refill Last Dose  . albuterol (PROAIR HFA) 108 (90 BASE) MCG/ACT inhaler Inhale 2 puffs into the lungs every 4 (four) hours as needed.    07/05/2017 at 0400  . ALPRAZolam (XANAX) 1 MG tablet Take 1 mg by mouth 2 (two) times daily.   07/05/2017 at 0400  . DULoxetine (CYMBALTA) 60 MG capsule Take 60 mg by mouth every evening.    Past Week at Unknown time  . eszopiclone (LUNESTA) 2 MG TABS tablet Take 2 mg by mouth at bedtime. Take immediately before bedtime   Past Week at Unknown time  . Fluticasone-Salmeterol (ADVAIR DISKUS) 250-50 MCG/DOSE AEPB Inhale 1 puff into the lungs 2 (two) times daily.    Past Week at Unknown time  . loratadine (CLARITIN) 10 MG tablet Take 5 mg by mouth every evening.    07/04/2017 at Unknown time  . omeprazole (PRILOSEC) 40 MG capsule Take 40 mg by mouth every evening.   07/04/2017 at Unknown time  . tiotropium (SPIRIVA HANDIHALER) 18 MCG inhalation capsule Place 18 mcg into inhaler and inhale every morning.    07/05/2017 at 0400   Allergies:  Allergies  Allergen Reactions  . Oxycodone Itching    Family History  Problem Relation Age  of Onset  . Cancer Mother        breast  . Cancer Father        unknown   Social History:  reports that he has been smoking cigars.  He has smoked for the past 60.00 years. He has never used smokeless tobacco. He reports that he does not drink alcohol or use drugs.  ROS: A complete review of systems was performed.  All systems are negative except for pertinent findings as noted. ROS   Physical Exam:  Vital signs in last 24 hours: Temp:  [98.2 F (36.8 C)] 98.2 F (36.8 C) (04/30 0623) Pulse Rate:  [83] 83 (04/30 0623) Resp:  [18] 18 (04/30 0623) BP: (124)/(77) 124/77 (04/30 0623) SpO2:  [94 %] 94 % (04/30 0623) Weight:  [72.6 kg (160 lb)] 72.6 kg (160 lb) (04/30 3235) General:  Alert and oriented, No acute  distress HEENT: Normocephalic, atraumatic Cardiovascular: Regular rate and rhythm Lungs: Regular rate and effort Abdomen: Soft, nontender, nondistended, no abdominal masses Extremities: No edema Neurologic: Grossly intact  Laboratory Data:  Results for orders placed or performed during the hospital encounter of 07/05/17 (from the past 24 hour(s))  CBC     Status: None   Collection Time: 07/05/17  6:35 AM  Result Value Ref Range   WBC 10.0 4.0 - 10.5 K/uL   RBC 5.04 4.22 - 5.81 MIL/uL   Hemoglobin 15.9 13.0 - 17.0 g/dL   HCT 48.0 39.0 - 52.0 %   MCV 95.2 78.0 - 100.0 fL   MCH 31.5 26.0 - 34.0 pg   MCHC 33.1 30.0 - 36.0 g/dL   RDW 14.6 11.5 - 15.5 %   Platelets 276 150 - 400 K/uL   No results found for this or any previous visit (from the past 240 hour(s)). Creatinine: No results for input(s): CREATININE in the last 168 hours.  Impression/Assessment:  Intermediate risk prostate cancer  Plan:  I discussed with the patient the nature, potential benefits, risks and alternatives to biodegradable prostate spacer and brachytherapy radioactive seed implant, including side effects of the proposed treatment, the likelihood of the patient achieving the goals of the procedure, and any potential problems that might occur during the procedure or recuperation. All questions answered. Patient elects to proceed.  He was brought to the hospital with a transportation [place] and may need to stay here for social reasons.  He may be able to get in touch with his daughter who can sign him out and stay with him this evening.   Festus Aloe 07/05/2017, 7:34 AM

## 2017-07-05 NOTE — Op Note (Signed)
Preoperative diagnosis: Stage I (T2aNxMx) Intermediate Risk Prostate cancer Postoperative diagnosis: Same  Procedure: Prostate brachytherapy seed implant, Cystoscopy; Transperineal placement of biodegradable material, peri-prostatic, single   Surgeon: Junious Silk  Radiation oncologist: Tammi Klippel  Anesthesia: Gen.  Indication for procedure: 77 year old with intermediate risk prostate cancer who elected to proceed with prostate brachytherapy and Transperineal placement of biodegradable material.  Findings: On fluoroscopic imaging there was adequate coverage of the prostate. On cystoscopy the urethra appeared normal, the prostatic urethra appeared normal, the trigone and ureteral orifices appeared normal with clear efflux. The bladder mucosa appeared normal. There were no stones, foreign bodies or seeds visualized in the bladder.   Dose: 145 Gy (21 catheters and 69 active sources)   Description of procedure: After consent was obtained patient brought to the operating room. After adequate anesthesia he is placed in lithotomy position and the transrectal ultrasound probe and perineal template positioned. Catheters and brachytherapy seeds were placed per Dr. Johny Shears plan. A total of 21 catheters and 69 active sources (I-125) were placed. The anchoring needles, template and ultrasound were removed. Scout flouro imaging was obtained of the implant. An 18 gauge hydrodissection needle was passed through the perineum with a sagittal view of the needle.  The needle was then advanced to the mid prostate along denonvilliers' fascia and anterior to the anterior rectal wall.  Saline was injected to confirm needle location.  The sagittal plane was checked and again the needle was noted to be in the space between the prostatic capsule and the anterior rectal wall.  Further injection of saline confirmed needle placement and provided sagittal dissection.  The saline was removed from the needle and the needle held  in position.  The dual-chamber space or plunger was then attached to the needle and injected and about a 10-second window.  Imaging confirmed placement of the biodegradable gel between denonvilliers' fascia and the anterior rectal wall.  The needle was removed. The Foley was removed. The patient was prepped again and cystoscopy was performed which was noted to be normal. I&O cath was performed to drain the bladder. He was awakened taken to the recovery room in stable condition.  Complications: None  Blood loss: Minimal  Specimens: None  Drains: None  Disposition: Patient stable to PACU.

## 2017-07-05 NOTE — Anesthesia Postprocedure Evaluation (Signed)
Anesthesia Post Note  Patient: SHAHEER BONFIELD  Procedure(s) Performed: RADIOACTIVE SEED IMPLANT/BRACHYTHERAPY IMPLANT (N/A Prostate) SPACE OAR INSTILLATION (N/A Rectum)     Patient location during evaluation: PACU Anesthesia Type: General Level of consciousness: awake and alert Pain management: pain level controlled Vital Signs Assessment: post-procedure vital signs reviewed and stable Respiratory status: spontaneous breathing, nonlabored ventilation, respiratory function stable and patient connected to nasal cannula oxygen Cardiovascular status: blood pressure returned to baseline and stable Postop Assessment: no apparent nausea or vomiting Anesthetic complications: no    Last Vitals:  Vitals:   07/05/17 1000 07/05/17 1015  BP: (!) 97/58 114/69  Pulse: 82 87  Resp: 14 15  Temp:    SpO2: 93% 92%    Last Pain:  Vitals:   07/05/17 1345  TempSrc:   PainSc: 0-No pain   Pain Goal:                 Karyl Kinnier Darlette Dubow

## 2017-07-05 NOTE — Discharge Instructions (Signed)
Brachytherapy/Hydrogel Spacer for Prostate Cancer, Care After Refer to this sheet in the next few weeks. These instructions provide you with information on caring for yourself after your procedure. Your health care provider may also give you more specific instructions. Your treatment has been planned according to current medical practices, but problems sometimes occur. Call your health care provider if you have any problems or questions after your procedure. What can I expect after the procedure? The area behind the scrotum will probably be tender and bruised. For a short period of time you may have:  Difficulty passing urine. You may need a catheter for a few days to a month.  Blood in the urine or semen.  A feeling of constipation because of prostate swelling.  Frequent feeling of an urgent need to urinate.  For a long period of time you may have:  Inflammation of the rectum. This happens in about 2% of people who have the procedure.  Erection problems. These vary with age and occur in about 15-40% of men.  Difficulty urinating. This is caused by scarring in the urethra.  Diarrhea.  Follow these instructions at home:  Take medicines only as directed by your health care provider.  You will probably have a catheter in your bladder for several days. You will have blood in the urine bag and should drink a lot of fluids to keep it a light red color.  Keep all follow-up visits as directed by your health care provider. If you have a catheter, it will be removed during one of these visits.  Try not to sit directly on the area behind the scrotum. A soft cushion can decrease the discomfort. Ice packs may also be helpful for the discomfort. Do not put ice directly on the skin.  Shower and wash the area behind the scrotum gently. Do not sit in a tub.  If you have had the brachytherapy that uses the seeds, limit your close contact with children and pregnant women for 2 months because of the  radiation still in the prostate. After that period of time, the levels drop off quickly. Get help right away if:  You have a fever.  You have chills.  You have shortness of breath.  You have chest pain.  You have thick blood, like tomato juice, in the urine bag.  Your catheter is blocked so urine cannot get into the bag. Your bladder area or lower abdomen may be swollen.  There is excessive bleeding from your rectum. It is normal to have a little blood mixed with your stool.  There is severe discomfort in the treated area that does not go away with pain medicine.  You have abdominal discomfort.  You have severe nausea or vomiting.  You develop any new or unusual symptoms. This information is not intended to replace advice given to you by your health care provider. Make sure you discuss any questions you have with your health care provider. Document Released: 03/27/2010 Document Revised: 08/06/2015 Document Reviewed: 08/15/2012 Elsevier Interactive Patient Education  2017 [place] [facility] Care Instructions  Activity: Get plenty of rest for the remainder of the day. A responsible individual must stay with you for 24 hours following the procedure.  For the next 24 hours, DO NOT: -Drive a car -Paediatric nurse -Drink alcoholic beverages -Take any medication unless instructed by your physician -Make any legal decisions or sign important papers.  Meals: Start with liquid foods such as gelatin or soup. Progress to  regular foods as tolerated. Avoid greasy, spicy, heavy foods. If nausea and/or vomiting occur, drink only clear liquids until the nausea and/or vomiting subsides. Call your physician if vomiting continues.  Special Instructions/Symptoms: Your throat may feel dry or sore from the anesthesia or the breathing tube placed in your throat during surgery. If this causes discomfort, gargle with warm salt water. The discomfort should disappear within 24  hours.  If you had a scopolamine patch placed behind your ear for the management of post- operative nausea and/or vomiting:  1. The medication in the patch is effective for 72 hours, after which it should be removed.  Wrap patch in a tissue and discard in the trash. Wash hands thoroughly with soap and water. 2. You may remove the patch earlier than 72 hours if you experience unpleasant side effects which may include dry mouth, dizziness or visual disturbances. 3. Avoid touching the patch. Wash your hands with soap and water after contact with the patch.

## 2017-07-06 ENCOUNTER — Encounter (HOSPITAL_BASED_OUTPATIENT_CLINIC_OR_DEPARTMENT_OTHER): Payer: Self-pay | Admitting: Urology

## 2017-07-06 NOTE — Discharge Summary (Signed)
Physician Discharge Summary  Patient ID: Todd Keith MRN: [id number] DOB/AGE: Dec 22, 1940 77 y.o.  Admit date: 07/05/2017 Discharge date: 07/06/2017  Admission Diagnoses: prostate cancer   Discharge Diagnoses:  Active Problems:   Prostate cancer [facility])   Discharged Condition: good  Hospital Course: Patient was admitted to extended stay unit at [facility] for observation. He remained stable overnight and was discharged in the morning of POD#1.   Consults: None  Significant Diagnostic Studies: none  Treatments: surgery: prostate brachytherapy   Discharge Exam: Blood pressure 110/73, pulse 77, temperature 97.6 F (36.4 C), resp. rate 16, height 5\' 11"  (1.803 m), weight 72.6 kg (160 lb), SpO2 96 %. Patient was discharged by the nurse   Disposition: Discharge disposition: 01-Home or Self Care       Discharge Instructions    Discharge patient   Complete by:  As directed    Discharge disposition:  01-Home or Self Care   Discharge patient date:  07/05/2017     Allergies as of 07/05/2017      Reactions   Oxycodone Itching      Medication List    TAKE these medications   ADVAIR DISKUS 250-50 MCG/DOSE Aepb Generic drug:  Fluticasone-Salmeterol Inhale 1 puff into the lungs 2 (two) times daily.   ALPRAZolam 1 MG tablet Commonly known as:  XANAX Take 1 mg by mouth 2 (two) times daily.   cephALEXin 500 MG capsule Commonly known as:  KEFLEX Take 1 capsule (500 mg total) by mouth 2 (two) times daily.   DULoxetine 60 MG capsule Commonly known as:  CYMBALTA Take 60 mg by mouth every evening.   eszopiclone 2 MG Tabs tablet Commonly known as:  LUNESTA Take 2 mg by mouth at bedtime. Take immediately before bedtime   loratadine 10 MG tablet Commonly known as:  CLARITIN Take 5 mg by mouth every evening.   omeprazole 40 MG capsule Commonly known as:  PRILOSEC Take 40 mg by mouth every evening.   PROAIR HFA 108 (90 Base) MCG/ACT inhaler Generic  drug:  albuterol Inhale 2 puffs into the lungs every 4 (four) hours as needed.   SPIRIVA HANDIHALER 18 MCG inhalation capsule Generic drug:  tiotropium Place 18 mcg into inhaler and inhale every morning.   traMADol 50 MG tablet Commonly known as:  ULTRAM Take 1 tablet (50 mg total) by mouth every 6 (six) hours as needed.      Follow-up Information    Festus Aloe, MD In 3 weeks.   Specialty:  Urology Contact information: [place] [address] [phone]           Signed: Festus Aloe 07/06/2017, 4:18 PM

## 2017-07-07 NOTE — Progress Notes (Signed)
  Radiation Oncology         [phone] ________________________________  Name: Todd Keith MRN: [id number]  Date: 07/07/2017  DOB: [birth year]       Prostate Seed Implant  CC:[facility]  No ref. provider found  DIAGNOSIS: 77 y.o. 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 Malignant neoplasm of prostate [facility]) [place] Discharge patient    PROCEDURE: Insertion of radioactive I-125 seeds into the prostate gland.  RADIATION DOSE: 145 Gy, definitive therapy.  TECHNIQUE: HUTSON LUFT was brought to the operating room with the urologist. He was placed in the dorsolithotomy position. He was catheterized and a rectal tube was inserted. The perineum was shaved, prepped and draped. The ultrasound probe was then introduced into the rectum to see the prostate gland.  TREATMENT DEVICE: A needle grid was attached to the ultrasound probe stand and anchor needles were placed.  3D PLANNING: The prostate was imaged in 3D using a sagittal sweep of the prostate probe. These images were transferred to the planning computer. There, the prostate, urethra and rectum were defined on each axial reconstructed image. Then, the software created an optimized 3D plan and a few seed positions were adjusted. The quality of the plan was reviewed using [facility] information for the target and the following two organs at risk:  Urethra and Rectum.  Then the accepted plan was uploaded to the seed Selectron afterloading unit.  PROSTATE VOLUME STUDY:  Using transrectal ultrasound the volume of the prostate was verified to be 39.55 cc.  SPECIAL TREATMENT PROCEDURE/SUPERVISION AND HANDLING: The Nucletron FIRST system was used to place the needles under sagittal guidance. A total of 21 needles were used to deposit 69 seeds in the prostate gland. The individual seed activity was 0.438 mCi.  SpaceOAR:  Yes  COMPLEX  SIMULATION: At the end of the procedure, an anterior radiograph of the pelvis was obtained to document seed positioning and count. Cystoscopy was performed to check the urethra and bladder.  MICRODOSIMETRY: At the end of the procedure, the patient was emitting 0.23 mR/hr at 1 meter. Accordingly, he was considered safe for hospital discharge.  PLAN: The patient will return to the radiation oncology clinic for post implant CT dosimetry in three weeks.   ________________________________  Sheral Apley Tammi Klippel, M.D.

## 2017-07-14 ENCOUNTER — Telehealth: Payer: Self-pay | Admitting: *Deleted

## 2017-07-14 NOTE — Telephone Encounter (Signed)
CALLED PATIENT TO REMIND OF POST SEED APPTS. FOR 07-15-17, SPOKE WITH PATIENT AND HE IS AWARE OF THESE APPTS.

## 2017-07-15 ENCOUNTER — Ambulatory Visit
Admit: 2017-07-15 | Discharge: 2017-07-15 | Disposition: A | Discharge status: Home / Self Care | Payer: Medicare Other | Attending: Radiation Oncology | Admitting: Radiation Oncology

## 2017-07-15 ENCOUNTER — Ambulatory Visit
Admission: RE | Admit: 2017-07-15 | Discharge: 2017-07-15 | Disposition: A | Discharge status: Home / Self Care | Payer: Medicare Other | Source: Ambulatory Visit | Attending: Radiation Oncology | Admitting: Radiation Oncology

## 2017-07-15 DIAGNOSIS — C61 Malignant neoplasm of prostate: Secondary | ICD-10-CM | POA: Insufficient documentation

## 2017-07-15 DIAGNOSIS — Z510 Encounter for antineoplastic radiation therapy: Secondary | ICD-10-CM | POA: Insufficient documentation

## 2017-07-15 NOTE — Progress Notes (Signed)
  Radiation Oncology         [phone] ________________________________  Name: PARAS KREIDER MRN: [id number]  Date: 07/15/2017  DOB: Nov 26, 1940  COMPLEX SIMULATION NOTE  NARRATIVE:  The patient was brought to the [place] today following prostate seed implantation approximately one month ago.  Identity was confirmed.  All relevant records and images related to the planned course of therapy were reviewed.  Then, the patient was set-up supine.  CT images were obtained.  The CT images were loaded into the planning software.  Then the prostate and rectum were contoured.  Treatment planning then occurred.  The implanted iodine 125 seeds were identified by the physics staff for projection of radiation distribution  I have requested : 3D Simulation  I have requested a DVH of the following structures: Prostate and rectum.    ________________________________  Sheral Apley Tammi Klippel, M.D.  This document serves as a record of services personally performed by Tyler Pita, MD. It was created on his behalf by Arlyce Harman, a trained medical scribe. The creation of this record is based on the scribe's personal observations and the provider's statements to them. This document has been checked and approved by the attending provider.

## 2017-07-16 ENCOUNTER — Ambulatory Visit (HOSPITAL_COMMUNITY): Payer: Medicare Other

## 2017-07-21 ENCOUNTER — Telehealth: Payer: Self-pay | Admitting: *Deleted

## 2017-07-21 NOTE — Telephone Encounter (Signed)
CALLED PATIENT TO REMIND OF POST SEED APPTS. FOR 07-22-17, SPOKE WITH PATIENT AND HE IS AWARE OF THESE APPTS.

## 2017-07-22 ENCOUNTER — Ambulatory Visit
Admission: RE | Admit: 2017-07-22 | Discharge: 2017-07-22 | Disposition: A | Discharge status: Home / Self Care | Payer: Medicare Other | Source: Ambulatory Visit | Attending: Radiation Oncology | Admitting: Radiation Oncology

## 2017-07-22 ENCOUNTER — Other Ambulatory Visit: Payer: Self-pay

## 2017-07-22 ENCOUNTER — Ambulatory Visit (HOSPITAL_COMMUNITY)
Admission: RE | Admit: 2017-07-22 | Discharge: 2017-07-22 | Disposition: A | Discharge status: Home / Self Care | Payer: Medicare Other | Source: Ambulatory Visit | Attending: Urology | Admitting: Urology

## 2017-07-22 ENCOUNTER — Encounter: Payer: Self-pay | Admitting: Radiation Oncology

## 2017-07-22 VITALS — BP 109/75 | HR 84 | Temp 98.9°F | Resp 20 | Wt 167.0 lb

## 2017-07-22 DIAGNOSIS — C61 Malignant neoplasm of prostate: Secondary | ICD-10-CM | POA: Insufficient documentation

## 2017-07-22 DIAGNOSIS — Z510 Encounter for antineoplastic radiation therapy: Secondary | ICD-10-CM | POA: Diagnosis not present

## 2017-07-22 NOTE — Progress Notes (Signed)
Weight and vitals stable. Denies pain. Pre seed IPSS 3. Post seed IPSS 11. Dneies dysuria, hematuria, urinary leakage or diarrhea. Patient scheduled for MRI today at 4 pm to confirm SpaceOar placement. Scheduled to follow up with Jiles Crocker at [facility] on 07/25/2017 and Dr. Junious Silk on 813/2019   BP 109/75 (BP Location: Right Arm, Patient Position: Sitting, Cuff Size: Normal)   Pulse 84   Temp 98.9 F (37.2 C) (Oral)   Resp 20   Wt 167 lb (75.8 kg)   SpO2 96%   BMI 23.29 kg/m  Wt Readings from Last 3 Encounters:  07/22/17 167 lb (75.8 kg)  07/05/17 160 lb (72.6 kg)  03/09/17 164 lb (74.4 kg)

## 2017-07-22 NOTE — Progress Notes (Signed)
  Radiation Oncology         [phone] ________________________________  Name: ALFREDO SPONG MRN: [id number]  Date: 07/22/2017  DOB: 1940-10-02  COMPLEX SIMULATION NOTE  NARRATIVE:  The patient was brought to the [facility] today following prostate seed implantation approximately one month ago.  Identity was confirmed.  All relevant records and images related to the planned course of therapy were reviewed.  Then, the patient was set-up supine.  CT images were obtained.  The CT images were loaded into the planning software.  Then the prostate and rectum were contoured.  Treatment planning then occurred.  The implanted iodine 125 seeds were identified by the physics staff for projection of radiation distribution  I have requested : 3D Simulation  I have requested a DVH of the following structures: Prostate and rectum.    ________________________________  Sheral Apley Tammi Klippel, M.D.  This document serves as a record of services personally performed by Tyler Pita, MD. It was created on his behalf by Rae Lips, a trained medical scribe. The creation of this record is based on the scribe's personal observations and the provider's statements to them. This document has been checked and approved by the attending provider.

## 2017-07-22 NOTE — Progress Notes (Signed)
Radiation Oncology         [phone] ________________________________  Name: Todd Keith MRN: [id number]  Date: 07/22/2017  DOB: [birth year]  Post-Seed Follow-Up Visit Note  CC: Todd Keith  Dickens, Rodman Key, MD  Diagnosis:   77 y.o. 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 Malignant neoplasm of prostate ([place]) C61     Interval Since Last Radiation:  2.5 weeks - Insertion of radioactive I-125 seeds into Todd prostate gland, 145 Gy definitive therapy, with SpaceOAR  Narrative:  Todd patient returns today for routine follow-up.  He is complaining of increased urinary frequency and urinary hesitation symptoms. He filled out a questionnaire regarding urinary function today providing an overall IPSS score of 11 characterizing his symptoms as mild-moderate.  His pre-implant score was 3. He denies any bowel symptoms.  He continues with a weakened flow of stream and occasional feelings of incomplete bladder emptying as well as daytime frequency.  He denies nocturia, dysuria, gross hematuria, intermittency or straining to void.  His biggest complaint at this point is persistent perineal discomfort with prolonged sitting as well as pain in bilateral hips, right greater than left present since Todd time of this procedure.  He has been using tramadol 50 mg as needed which only provides mild temporary relief.  ALLERGIES:  is allergic to oxycodone.  Meds: Current Outpatient Medications  Medication Sig Dispense Refill  . albuterol (PROAIR HFA) 108 (90 BASE) MCG/ACT inhaler Inhale 2 puffs into Todd lungs every 4 (four) hours as needed.     . ALPRAZolam (XANAX) 1 MG tablet Take 1 mg by mouth 2 (two) times daily.    . cephALEXin (KEFLEX) 500 MG capsule Take 1 capsule (500 mg total) by mouth 2 (two) times daily. 10 capsule 0  . clonazePAM (KLONOPIN) 1 MG tablet   0  . DULoxetine (CYMBALTA) 60 MG  capsule Take 60 mg by mouth every evening.     . eszopiclone (LUNESTA) 2 MG TABS tablet Take 2 mg by mouth at bedtime. Take immediately before bedtime    . Fluticasone-Salmeterol (ADVAIR DISKUS) 250-50 MCG/DOSE AEPB Inhale 1 puff into Todd lungs 2 (two) times daily.     Marland Kitchen levocetirizine (XYZAL) 5 MG tablet   0  . loratadine (CLARITIN) 10 MG tablet Take 5 mg by mouth every evening.     Marland Kitchen NARCAN 4 MG/0.1ML LIQD nasal spray kit   0  . omeprazole (PRILOSEC) 40 MG capsule Take 40 mg by mouth every evening.    Marland Kitchen SPIRIVA RESPIMAT 2.5 MCG/ACT AERS   0  . traMADol (ULTRAM) 50 MG tablet Take 1 tablet (50 mg total) by mouth every 6 (six) hours as needed. 12 tablet 0  . triamcinolone cream (KENALOG) 0.1 %      No current facility-administered medications for this encounter.     Physical Findings: In general this is a well appearing Caucasian male in no acute distress. He's alert and oriented x4 and appropriate throughout Todd examination. Cardiopulmonary assessment is negative for acute distress and he exhibits normal effort.   Lab Findings: Lab Results  Component Value Date   WBC 10.0 07/05/2017   HGB 15.9 07/05/2017   HCT 48.0 07/05/2017   MCV 95.2 07/05/2017   PLT 276 07/05/2017    Radiographic Findings:  Patient underwent CT imaging in our clinic for post implant dosimetry. Todd CT was  reviewed by Dr. Tammi Klippel and appears to demonstrate an adequate distribution of radioactive seeds throughout Todd prostate gland. There are no seeds in or near Todd rectum. We suspect Todd final radiation plan and dosimetry will show appropriate coverage of Todd prostate gland.   Impression/Plan: Todd patient is recovering from Todd effects of radiation. His urinary symptoms should gradually improve over Todd next 4-6 months. We talked about this today. He is encouraged by his improvement already and is otherwise pleased with his outcome. We also talked about long-term follow-up for prostate cancer following seed implant.  He understands that ongoing PSA determinations and digital rectal exams will help perform surveillance to rule out disease recurrence. He has a follow up appointment scheduled with Dr. Junious Silk on 07/25/17. He understands what to expect with his PSA measures. Patient was also educated today about some of Todd long-term effects from radiation including a small risk for rectal bleeding and possibly erectile dysfunction. We talked about some of Todd general management approaches to these potential complications. However, I did encourage Todd patient to contact our office or return at any point if he has questions or concerns related to his previous radiation and prostate cancer.    Nicholos Johns, PA-C    Tyler Pita, MD  [facility] Direct Dial: [phone]  Fax: [phone] [email or web address]  Skype  LinkedIn    Page Me   This document serves as a record of services personally performed by Tyler Pita, MD and Freeman Caldron, PA-C. It was created on their behalf by Rae Lips, a trained medical scribe. Todd creation of this record is based on Todd scribe's personal observations and Todd providers' statements to them. This document has been checked and approved by Todd attending providers.

## 2017-07-28 ENCOUNTER — Encounter: Payer: Self-pay | Admitting: Radiation Oncology

## 2017-07-28 DIAGNOSIS — C61 Malignant neoplasm of prostate: Secondary | ICD-10-CM | POA: Diagnosis not present

## 2017-08-01 NOTE — Progress Notes (Signed)
  Radiation Oncology         [phone] ________________________________  Name: Todd Keith MRN: [id number]  Date: 07/28/2017  DOB: [birth year]  3D Planning Note   Prostate Brachytherapy Post-Implant Dosimetry  Diagnosis: 77 y.o. gentleman with stage T2a adenocarcinoma of the prostate with a Gleason's score of 3+4 and a PSA of 5.91   Narrative: On a previous date, Todd Keith returned following prostate seed implantation for post implant planning. He underwent CT scan complex simulation to delineate the three-dimensional structures of the pelvis and demonstrate the radiation distribution.  Since that time, the seed localization, and complex isodose planning with dose volume histograms have now been completed.  Results:   Prostate Coverage - The dose of radiation delivered to the 90% or more of the prostate gland (D90) was 110.32% of the prescription dose. This exceeds our goal of greater than 90%. Rectal Sparing - The volume of rectal tissue receiving the prescription dose or higher was 0.03 cc. This falls under our thresholds tolerance of 1.0 cc.  Impression: The prostate seed implant appears to show adequate target coverage and appropriate rectal sparing.  Plan:  The patient will continue to follow with urology for ongoing PSA determinations. I would anticipate a high likelihood for local tumor control with minimal risk for rectal morbidity.  ________________________________  Sheral Apley Tammi Klippel, M.D.

## 2018-05-09 ENCOUNTER — Encounter (HOSPITAL_COMMUNITY)
Admission: RE | Admit: 2018-05-09 | Discharge: 2018-05-09 | Disposition: A | Discharge status: Home / Self Care | Payer: Medicare Other | Source: Ambulatory Visit | Attending: Ophthalmology | Admitting: Ophthalmology

## 2018-05-15 ENCOUNTER — Encounter (HOSPITAL_COMMUNITY): Payer: Self-pay | Admitting: *Deleted

## 2018-05-15 ENCOUNTER — Other Ambulatory Visit: Payer: Self-pay

## 2018-05-15 ENCOUNTER — Ambulatory Visit (HOSPITAL_COMMUNITY)
Admission: RE | Admit: 2018-05-15 | Discharge: 2018-05-15 | Disposition: A | Discharge status: Home / Self Care | Payer: Medicare Other | Attending: Ophthalmology | Admitting: Ophthalmology

## 2018-05-15 ENCOUNTER — Ambulatory Visit (HOSPITAL_COMMUNITY): Payer: Medicare Other | Admitting: Anesthesiology

## 2018-05-15 ENCOUNTER — Encounter (HOSPITAL_COMMUNITY)
Admission: RE | Disposition: A | Discharge status: Home / Self Care | Payer: Self-pay | Source: Home / Self Care | Attending: Ophthalmology

## 2018-05-15 DIAGNOSIS — J449 Chronic obstructive pulmonary disease, unspecified: Secondary | ICD-10-CM | POA: Insufficient documentation

## 2018-05-15 DIAGNOSIS — G4733 Obstructive sleep apnea (adult) (pediatric): Secondary | ICD-10-CM | POA: Diagnosis not present

## 2018-05-15 DIAGNOSIS — Z79899 Other long term (current) drug therapy: Secondary | ICD-10-CM | POA: Diagnosis not present

## 2018-05-15 DIAGNOSIS — F17200 Nicotine dependence, unspecified, uncomplicated: Secondary | ICD-10-CM | POA: Insufficient documentation

## 2018-05-15 DIAGNOSIS — Z8507 Personal history of malignant neoplasm of pancreas: Secondary | ICD-10-CM | POA: Diagnosis not present

## 2018-05-15 DIAGNOSIS — K219 Gastro-esophageal reflux disease without esophagitis: Secondary | ICD-10-CM | POA: Insufficient documentation

## 2018-05-15 DIAGNOSIS — G4730 Sleep apnea, unspecified: Secondary | ICD-10-CM | POA: Diagnosis not present

## 2018-05-15 DIAGNOSIS — H25811 Combined forms of age-related cataract, right eye: Secondary | ICD-10-CM | POA: Diagnosis present

## 2018-05-15 DIAGNOSIS — Z8546 Personal history of malignant neoplasm of prostate: Secondary | ICD-10-CM | POA: Diagnosis not present

## 2018-05-15 DIAGNOSIS — H353111 Nonexudative age-related macular degeneration, right eye, early dry stage: Secondary | ICD-10-CM | POA: Insufficient documentation

## 2018-05-15 HISTORY — PX: CATARACT EXTRACTION W/PHACO: SHX586

## 2018-05-15 SURGERY — PHACOEMULSIFICATION, CATARACT, WITH IOL INSERTION
Anesthesia: Monitor Anesthesia Care | Site: Eye | Laterality: Right

## 2018-05-15 MED ORDER — PHENYLEPHRINE HCL 2.5 % OP SOLN
1.0000 [drp] | OPHTHALMIC | Status: AC
  Administered 2018-05-15 (×3): 1 [drp] via OPHTHALMIC

## 2018-05-15 MED ORDER — BSS IO SOLN
INTRAOCULAR | Status: DC | PRN
  Administered 2018-05-15: 15 mL

## 2018-05-15 MED ORDER — CYCLOPENTOLATE-PHENYLEPHRINE 0.2-1 % OP SOLN
1.0000 [drp] | OPHTHALMIC | Status: AC
  Administered 2018-05-15 (×3): 1 [drp] via OPHTHALMIC

## 2018-05-15 MED ORDER — PROMETHAZINE HCL 25 MG/ML IJ SOLN: 6.2500 mg | INTRAMUSCULAR | Status: DC | PRN

## 2018-05-15 MED ORDER — TETRACAINE HCL 0.5 % OP SOLN
1.0000 [drp] | OPHTHALMIC | Status: AC
  Administered 2018-05-15 (×3): 1 [drp] via OPHTHALMIC

## 2018-05-15 MED ORDER — POVIDONE-IODINE 5 % OP SOLN
OPHTHALMIC | Status: DC | PRN
  Administered 2018-05-15: 1 via OPHTHALMIC

## 2018-05-15 MED ORDER — NEOMYCIN-POLYMYXIN-DEXAMETH 3.5-10000-0.1 OP SUSP
OPHTHALMIC | Status: DC | PRN
  Administered 2018-05-15: 2 [drp] via OPHTHALMIC

## 2018-05-15 MED ORDER — EPINEPHRINE PF 1 MG/ML IJ SOLN
INTRAOCULAR | Status: DC | PRN
  Administered 2018-05-15: 500 mL

## 2018-05-15 MED ORDER — PROVISC 10 MG/ML IO SOLN
INTRAOCULAR | Status: DC | PRN
  Administered 2018-05-15: 0.85 mL via INTRAOCULAR

## 2018-05-15 MED ORDER — LIDOCAINE HCL 3.5 % OP GEL
1.0000 "application " | Freq: Once | OPHTHALMIC | Status: AC
  Administered 2018-05-15: 1 via OPHTHALMIC

## 2018-05-15 MED ORDER — MIDAZOLAM HCL 2 MG/2ML IJ SOLN: 0.5000 mg | Freq: Once | INTRAMUSCULAR | Status: DC | PRN

## 2018-05-15 MED ORDER — LIDOCAINE HCL (PF) 1 % IJ SOLN
INTRAOCULAR | Status: DC | PRN
  Administered 2018-05-15: 1 mL via OPHTHALMIC

## 2018-05-15 MED ORDER — SODIUM HYALURONATE 23 MG/ML IO SOLN
INTRAOCULAR | Status: DC | PRN
  Administered 2018-05-15: 0.6 mL via INTRAOCULAR

## 2018-05-15 SURGICAL SUPPLY — 15 items

## 2018-05-15 NOTE — Anesthesia Preprocedure Evaluation (Signed)
Anesthesia Evaluation  Patient identified by MRN, date of birth, ID band Patient awake    Reviewed: Allergy & Precautions, NPO status , Patient's Chart, lab work & pertinent test results  Airway Mallampati: II  TM Distance: >3 FB Neck ROM: Full    Dental no notable dental hx. (+) Edentulous Upper, Edentulous Lower   Pulmonary sleep apnea , COPD,  COPD inhaler, Current Smoker,    Pulmonary exam normal breath sounds clear to auscultation       Cardiovascular Exercise Tolerance: Good negative cardio ROS Normal cardiovascular examI Rhythm:Regular Rate:Normal     Neuro/Psych  Headaches, Anxiety Depression In soft collar for comfort  Reports OSA Dx , but states never given CPAP  Neuromuscular disease negative psych ROS   GI/Hepatic Neg liver ROS, GERD  Medicated and Controlled,H/o prostate Ca -s/p seeds    Endo/Other  negative endocrine ROS  Renal/GU negative Renal ROS  negative genitourinary   Musculoskeletal  (+) Arthritis ,   Abdominal   Peds negative pediatric ROS (+)  Hematology negative hematology ROS (+)   Anesthesia Other Findings   Reproductive/Obstetrics negative OB ROS                             Anesthesia Physical Anesthesia Plan  ASA: III  Anesthesia Plan: MAC   Post-op Pain Management:    Induction: Intravenous  PONV Risk Score and Plan:   Airway Management Planned: Nasal Cannula  Additional Equipment:   Intra-op Plan:   Post-operative Plan:   Informed Consent: I have reviewed the patients History and Physical, chart, labs and discussed the procedure including the risks, benefits and alternatives for the proposed anesthesia with the patient or authorized representative who has indicated his/her understanding and acceptance.     Dental advisory given  Plan Discussed with: CRNA  Anesthesia Plan Comments:         Anesthesia Quick Evaluation

## 2018-05-15 NOTE — Op Note (Signed)
Date of procedure: 05/15/18  Pre-operative diagnosis: Visually significant age-related cataract, Right Eye (H25.811)  Post-operative diagnosis: Visually significant age-related cataract, Right Eye  Procedure: Removal of cataract via phacoemulsification and insertion of intra-ocular lens Wynetta Emery and [employer] PCB00  +21.5D into the capsular bag of the Right Eye  Attending surgeon: Gerda Diss. Chelsi Warr, MD, MA  Anesthesia: MAC, Topical Ak[place]  Complications: None  Estimated Blood Loss: <39m (minimal)  Specimens: None  Implants: As above  Indications:  Visually significant age-related cataract, Right Eye  Procedure:  The patient was seen and identified in the pre-operative area. The operative eye was identified and dilated.  The operative eye was marked.  Topical anesthesia was administered to the operative eye.     The patient was then to the operative suite and placed in the supine position.  A timeout was performed confirming the patient, procedure to be performed, and all other relevant information.   The patient's face was prepped and draped in the usual fashion for intra-ocular surgery.  A lid speculum was placed into the operative eye and the surgical microscope moved into place and focused.  A superotemporal paracentesis was created using a 20 gauge paracentesis blade.  Shugarcaine was injected into the anterior chamber.  Viscoelastic was injected into the anterior chamber.  A temporal clear-corneal main wound incision was created using a 2.462mmicrokeratome.  A continuous curvilinear capsulorrhexis was initiated using an irrigating cystitome and completed using capsulorrhexis forceps.  Hydrodissection and hydrodeliniation were performed.  Viscoelastic was injected into the anterior chamber.  A phacoemulsification handpiece and a chopper as a second instrument were used to remove the nucleus and epinucleus. The irrigation/aspiration handpiece was used to remove any remaining cortical  material.   The capsular bag was reinflated with viscoelastic, checked, and found to be intact.  The intraocular lens was inserted into the capsular bag and dialed into place using a Kuglen hook.  The irrigation/aspiration handpiece was used to remove any remaining viscoelastic.  The clear corneal wound and paracentesis wounds were then hydrated and checked with Weck-Cels to be watertight.  The lid-speculum and drape was removed, and the patient's face was cleaned with a wet and dry 4x4.  Maxitrol was instilled in the eye before a clear shield was taped over the eye. The patient was taken to the post-operative care unit in good condition, having tolerated the procedure well.  Post-Op Instructions: [facility] and will receive all other orders and instructions.

## 2018-05-15 NOTE — H&P (Signed)
The H and P was reviewed and updated. The patient was examined.  No changes were found after exam.  The surgical eye was marked.  

## 2018-05-15 NOTE — Anesthesia Postprocedure Evaluation (Signed)
Anesthesia Post Note  Patient: Todd Keith  Procedure(s) Performed: CATARACT EXTRACTION PHACO AND INTRAOCULAR LENS PLACEMENT ([place]) (Right Eye)  Patient location during evaluation: PACU Anesthesia Type: MAC Level of consciousness: awake and alert and patient cooperative Pain management: pain level controlled Vital Signs Assessment: post-procedure vital signs reviewed and stable Respiratory status: spontaneous breathing, nonlabored ventilation and respiratory function stable Cardiovascular status: blood pressure returned to baseline Postop Assessment: no apparent nausea or vomiting Anesthetic complications: no     Last Vitals:  Vitals:   05/15/18 0754  BP: 103/68  Pulse: 75  Resp: 18  Temp: 37 C  SpO2: 94%    Last Pain:  Vitals:   05/15/18 0754  TempSrc: Oral  PainSc: 9                  Akon Reinoso J

## 2018-05-15 NOTE — Discharge Instructions (Signed)
Please discharge patient when stable, will follow up today with Dr. Tannia Contino at the [facility] office immediately following discharge.  Leave shield in place until visit.  All paperwork with discharge instructions will be given at the office. ° ° ° ° ° °Monitored Anesthesia Care, Care After °These instructions provide you with information about caring for yourself after your procedure. Your health care provider may also give you more specific instructions. Your treatment has been planned according to current medical practices, but problems sometimes occur. Call your health care provider if you have any problems or questions after your procedure. °What can I expect after the procedure? °After your procedure, you may: °· Feel sleepy for several hours. °· Feel clumsy and have poor balance for several hours. °· Feel forgetful about what happened after the procedure. °· Have poor judgment for several hours. °· Feel nauseous or vomit. °· Have a sore throat if you had a breathing tube during the procedure. °Follow these instructions at home: °For at least 24 hours after the procedure: ° °  ° °· Have a responsible adult stay with you. It is important to have someone help care for you until you are awake and alert. °· Rest as needed. °· Do not: °? Participate in activities in which you could fall or become injured. °? Drive. °? Use heavy machinery. °? Drink alcohol. °? Take sleeping pills or medicines that cause drowsiness. °? Make important decisions or sign legal documents. °? Take care of children on your own. °Eating and drinking °· Follow the diet that is recommended by your health care provider. °· If you vomit, drink water, juice, or soup when you can drink without vomiting. °· Make sure you have little or no nausea before eating solid foods. °General instructions °· Take over-the-counter and prescription medicines only as told by your health care provider. °· If you have sleep apnea, surgery and certain  medicines can increase your risk for breathing problems. Follow instructions from your health care provider about wearing your sleep device: °? Anytime you are sleeping, including during daytime naps. °? While taking prescription pain medicines, sleeping medicines, or medicines that make you drowsy. °· If you smoke, do not smoke without supervision. °· Keep all follow-up visits as told by your health care provider. This is important. °Contact a health care provider if: °· You keep feeling nauseous or you keep vomiting. °· You feel light-headed. °· You develop a rash. °· You have a fever. °Get help right away if: °· You have trouble breathing. °Summary °· For several hours after your procedure, you may feel sleepy and have poor judgment. °· Have a responsible adult stay with you for at least 24 hours or until you are awake and alert. °This information is not intended to replace advice given to you by your health care provider. Make sure you discuss any questions you have with your health care provider. °Document Released: 06/15/2015 Document Revised: 10/08/2016 Document Reviewed: 06/15/2015 °Elsevier Interactive Patient Education © 2019 [employer] ° °

## 2018-05-15 NOTE — Transfer of Care (Signed)
Immediate Anesthesia Transfer of Care Note  Patient: Todd Keith  Procedure(s) Performed: CATARACT EXTRACTION PHACO AND INTRAOCULAR LENS PLACEMENT ([facility]) (Right Eye)  Patient Location: Short Stay  Anesthesia Type:MAC  Level of Consciousness: awake and patient cooperative  Airway & Oxygen Therapy: Patient Spontanous Breathing  Post-op Assessment: Report given to RN, Post -op Vital signs reviewed and stable and Patient moving all extremities  Post vital signs: Reviewed and stable  Last Vitals:  Vitals Value Taken Time  BP    Temp    Pulse    Resp    SpO2      Last Pain:  Vitals:   05/15/18 0754  TempSrc: Oral  PainSc: 9       Patients Stated Pain Goal: 5 (01/60/10 9323)  Complications: No apparent anesthesia complications

## 2018-05-16 ENCOUNTER — Encounter (HOSPITAL_COMMUNITY): Payer: Self-pay | Admitting: Ophthalmology

## 2019-03-28 ENCOUNTER — Encounter (HOSPITAL_COMMUNITY): Payer: Self-pay

## 2019-03-28 ENCOUNTER — Other Ambulatory Visit: Payer: Self-pay

## 2019-03-28 ENCOUNTER — Emergency Department (HOSPITAL_COMMUNITY)
Admission: EM | Admit: 2019-03-28 | Discharge: 2019-03-28 | Disposition: A | Discharge status: Home / Self Care | Payer: Medicare Other | Attending: Emergency Medicine | Admitting: Emergency Medicine

## 2019-03-28 ENCOUNTER — Emergency Department (HOSPITAL_COMMUNITY): Payer: Medicare Other

## 2019-03-28 DIAGNOSIS — C61 Malignant neoplasm of prostate: Secondary | ICD-10-CM | POA: Diagnosis present

## 2019-03-28 DIAGNOSIS — Z79899 Other long term (current) drug therapy: Secondary | ICD-10-CM | POA: Diagnosis not present

## 2019-03-28 DIAGNOSIS — F17290 Nicotine dependence, other tobacco product, uncomplicated: Secondary | ICD-10-CM | POA: Diagnosis not present

## 2019-03-28 DIAGNOSIS — Z20822 Contact with and (suspected) exposure to covid-19: Secondary | ICD-10-CM | POA: Insufficient documentation

## 2019-03-28 DIAGNOSIS — R0602 Shortness of breath: Secondary | ICD-10-CM | POA: Diagnosis present

## 2019-03-28 DIAGNOSIS — D500 Iron deficiency anemia secondary to blood loss (chronic): Secondary | ICD-10-CM

## 2019-03-28 DIAGNOSIS — J449 Chronic obstructive pulmonary disease, unspecified: Secondary | ICD-10-CM | POA: Insufficient documentation

## 2019-03-28 LAB — CBC WITH DIFFERENTIAL/PLATELET
Abs Immature Granulocytes: 0.03 10*3/uL (ref 0.00–0.07)
Basophils Absolute: 0.1 10*3/uL (ref 0.0–0.1)
Basophils Relative: 1 %
Eosinophils Absolute: 0.3 10*3/uL (ref 0.0–0.5)
Eosinophils Relative: 3 %
HCT: 26.2 % — ABNORMAL LOW (ref 39.0–52.0)
Hemoglobin: 7.7 g/dL — ABNORMAL LOW (ref 13.0–17.0)
Immature Granulocytes: 0 %
Lymphocytes Relative: 21 %
Lymphs Abs: 2.1 10*3/uL (ref 0.7–4.0)
MCH: 23.7 pg — ABNORMAL LOW (ref 26.0–34.0)
MCHC: 29.4 g/dL — ABNORMAL LOW (ref 30.0–36.0)
MCV: 80.6 fL (ref 80.0–100.0)
Monocytes Absolute: 0.8 10*3/uL (ref 0.1–1.0)
Monocytes Relative: 7 %
Neutro Abs: 7 10*3/uL (ref 1.7–7.7)
Neutrophils Relative %: 68 %
Platelets: 417 10*3/uL — ABNORMAL HIGH (ref 150–400)
RBC: 3.25 MIL/uL — ABNORMAL LOW (ref 4.22–5.81)
RDW: 16.7 % — ABNORMAL HIGH (ref 11.5–15.5)
WBC: 10.3 10*3/uL (ref 4.0–10.5)
nRBC: 0 % (ref 0.0–0.2)

## 2019-03-28 LAB — COMPREHENSIVE METABOLIC PANEL
ALT: 14 U/L (ref 0–44)
AST: 20 U/L (ref 15–41)
Albumin: 3.5 g/dL (ref 3.5–5.0)
Alkaline Phosphatase: 60 U/L (ref 38–126)
Anion gap: 8 (ref 5–15)
BUN: 18 mg/dL (ref 8–23)
CO2: 23 mmol/L (ref 22–32)
Calcium: 8.4 mg/dL — ABNORMAL LOW (ref 8.9–10.3)
Chloride: 110 mmol/L (ref 98–111)
Creatinine, Ser: 1.19 mg/dL (ref 0.61–1.24)
GFR calc Af Amer: >60 mL/min (ref >60)
GFR calc non Af Amer: 57 mL/min — ABNORMAL LOW (ref >60)
Glucose, Bld: 107 mg/dL — ABNORMAL HIGH (ref 70–99)
Potassium: 3.5 mmol/L (ref 3.5–5.1)
Sodium: 141 mmol/L (ref 135–145)
Total Bilirubin: 0.4 mg/dL (ref 0.3–1.2)
Total Protein: 7 g/dL (ref 6.5–8.1)

## 2019-03-28 LAB — POC OCCULT BLOOD, ED: Fecal Occult Bld: POSITIVE — AB

## 2019-03-28 LAB — FOLATE: Folate: 15.6 ng/mL (ref >5.9)

## 2019-03-28 LAB — VITAMIN B12: Vitamin B-12: 111 pg/mL — ABNORMAL LOW (ref 180–914)

## 2019-03-28 LAB — RETICULOCYTES
Immature Retic Fract: 27.4 % — ABNORMAL HIGH (ref 2.3–15.9)
RBC.: 3.19 MIL/uL — ABNORMAL LOW (ref 4.22–5.81)
Retic Count, Absolute: 49.4 10*3/uL (ref 19.0–186.0)
Retic Ct Pct: 1.6 % (ref 0.4–3.1)

## 2019-03-28 LAB — IRON AND TIBC
Iron: 6 ug/dL — ABNORMAL LOW (ref 45–182)
Saturation Ratios: 1 % — ABNORMAL LOW (ref 17.9–39.5)
TIBC: 470 ug/dL — ABNORMAL HIGH (ref 250–450)
UIBC: 464 ug/dL

## 2019-03-28 LAB — FERRITIN: Ferritin: 3 ng/mL — ABNORMAL LOW (ref 24–336)

## 2019-03-28 LAB — RESPIRATORY PANEL BY RT PCR (FLU A&B, COVID)
Influenza A by PCR: NEGATIVE
Influenza B by PCR: NEGATIVE
SARS Coronavirus 2 by RT PCR: NEGATIVE

## 2019-03-28 LAB — TROPONIN I (HIGH SENSITIVITY): Troponin I (High Sensitivity): 5 ng/L (ref <18)

## 2019-03-28 MED ORDER — METHYLPREDNISOLONE SODIUM SUCC 125 MG IJ SOLR
125.0000 mg | Freq: Once | INTRAMUSCULAR | Status: AC
  Administered 2019-03-28: 125 mg via INTRAVENOUS
  Filled 2019-03-28: qty 2

## 2019-03-28 MED ORDER — AEROCHAMBER Z-STAT PLUS/MEDIUM MISC
Status: AC
  Administered 2019-03-28: 14:00:00 1
  Filled 2019-03-28: qty 1

## 2019-03-28 MED ORDER — PANTOPRAZOLE SODIUM 20 MG PO TBEC: 20.0000 mg | DELAYED_RELEASE_TABLET | Freq: Every day | ORAL | 0 refills | Status: DC

## 2019-03-28 MED ORDER — ALBUTEROL SULFATE HFA 108 (90 BASE) MCG/ACT IN AERS
6.0000 | INHALATION_SPRAY | RESPIRATORY_TRACT | Status: DC | PRN
  Administered 2019-03-28: 14:00:00 6 via RESPIRATORY_TRACT
  Filled 2019-03-28: qty 6.7

## 2019-03-28 MED ORDER — MAGNESIUM SULFATE 2 GM/50ML IV SOLN
2.0000 g | Freq: Once | INTRAVENOUS | Status: AC
  Administered 2019-03-28: 14:00:00 2 g via INTRAVENOUS
  Filled 2019-03-28: qty 50

## 2019-03-28 NOTE — Discharge Instructions (Signed)
Follow-up with your family doctor this week.  Also follow-up with the GI doctor.  Dr Laural Golden, use your inhaler every 4 hours 2 puffs if needed.  Return if getting worse.

## 2019-03-28 NOTE — ED Triage Notes (Signed)
Pt is getting more SOB for the last 2 months. Pt "puffs" 3-4 cigars everyday.

## 2019-03-28 NOTE — ED Provider Notes (Signed)
[facility] EMERGENCY DEPARTMENT Provider Note   CSN: [id number] Arrival date & time: 03/28/19  1247     History Chief Complaint  Patient presents with  . COPD    Todd Keith is a 79 y.o. male.  Patient has history of COPD.  Patient states he has been short of breath for a number days.  No other symptoms  The history is provided by the patient. No language interpreter was used.  COPD This is a recurrent problem. The current episode started 1 to 2 hours ago. The problem occurs constantly. The problem has not changed since onset.Associated symptoms include shortness of breath. Pertinent negatives include no chest pain, no abdominal pain and no headaches. Nothing aggravates the symptoms. Nothing relieves the symptoms.       Past Medical History:  Diagnosis Date  . Anxiety   . Arthritis    hips and back  . COPD (chronic obstructive pulmonary disease) ([place])   . Depression   . Dyspnea on exertion   . Floater, vitreous, left    post op cataract surgery 2017  . GERD (gastroesophageal reflux disease)   . Headache(784.0)   . History of retinal hemorrhage    post op cataract surgery left eye 2017 approx.,  resolved  . Mild sleep apnea    per pt had sleep study approx. was told had mild osa and no cpap recommended  . Neuropathy, peripheral    feet and up legs  . 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 Nocturia   . Prostate cancer [facility]) urologist-  dr eskridge/  oncologist-  dr Tammi Klippel   dx 11-09-2016  Stage T2a, Gleason 3+4, PSA 5.91, vol 32.93g-- scheduled for radioactive seed implants  . Wears glasses     Patient Active Problem List   Diagnosis Date Noted  . Prostate cancer ([place]) 07/05/2017  . Malignant neoplasm of prostate ([place]) 01/19/2017  . Cervical spondylosis without myelopathy 06/05/2012    Past Surgical History:  Procedure Laterality Date  . ANTERIOR CERVICAL DECOMP/DISCECTOMY FUSION N/A 06/05/2012   Procedure: Cervical six - seven  ANTERIOR CERVICAL  DECOMPRESSION/DISCECTOMY FUSION 1 LEVEL REMOVAL OF HARDWARE Cervical four - six;  Surgeon: Charlie Pitter, MD;  Location: [place] NEURO ORS;  Service: Neurosurgery;  Laterality: N/A;  . CATARACT EXTRACTION W/ INTRAOCULAR LENS IMPLANT Left 2017  approx.  Marland Kitchen CATARACT EXTRACTION W/PHACO Right 05/15/2018   Procedure: CATARACT EXTRACTION PHACO AND INTRAOCULAR LENS PLACEMENT ([place]);  Surgeon: Baruch Goldmann, MD;  Location: [facility];  Service: Ophthalmology;  Laterality: Right;  CDE: 25.69  . CERVICAL DISC SURGERY  1990s and 2004  approx.   C4-6  . PROSTATE BIOPSY  11-09-2016   dr eskridge office  . RADIOACTIVE SEED IMPLANT N/A 07/05/2017   Procedure: RADIOACTIVE SEED IMPLANT/BRACHYTHERAPY IMPLANT;  Surgeon: Festus Aloe, MD;  Location: [facility];  Service: Urology;  Laterality: N/A;  ONLY NEEDS 120 MIN FOR BOTH PROCEDURES  . SPACE OAR INSTILLATION N/A 07/05/2017   Procedure: SPACE OAR INSTILLATION;  Surgeon: Festus Aloe, MD;  Location: [facility];  Service: Urology;  Laterality: N/A;       Family History  Problem Relation Age of Onset  . Cancer Mother        breast  . Cancer Father        unknown    Social History   Tobacco Use  . Smoking status: Current Some Day Smoker    Years: 60.00    Types: Cigars  . Smokeless tobacco: Never  Used  . Tobacco comment: down to 1-2 cigars per day (the small ones)  Substance Use Topics  . Alcohol use: No  . Drug use: No    Home Medications Prior to Admission medications   Medication Sig Start Date End Date Taking? Authorizing Provider  albuterol (PROAIR HFA) 108 (90 BASE) MCG/ACT inhaler Inhale 2 puffs into the lungs every 4 (four) hours as needed.     [provider]  ALPRAZolam Duanne Moron) 1 MG tablet Take 1 mg by mouth 2 (two) times daily.    [provider]  cephALEXin (KEFLEX) 500 MG capsule Take 1 capsule (500 mg total) by mouth 2 (two) times daily. 07/05/17   Festus Aloe, MD  clonazePAM  Bobbye Charleston) 1 MG tablet  07/20/17   [provider]  DULoxetine (CYMBALTA) 60 MG capsule Take 60 mg by mouth every evening.     [provider]  eszopiclone (LUNESTA) 2 MG TABS tablet Take 2 mg by mouth at bedtime. Take immediately before bedtime    [provider]  Fluticasone-Salmeterol (ADVAIR DISKUS) 250-50 MCG/DOSE AEPB Inhale 1 puff into the lungs 2 (two) times daily.  10/08/14   [provider]  levocetirizine (XYZAL) 5 MG tablet  07/16/17   [provider]  loratadine (CLARITIN) 10 MG tablet Take 5 mg by mouth every evening.     [provider]  NARCAN 4 MG/0.1ML LIQD nasal spray kit  07/20/17   [provider]  omeprazole (PRILOSEC) 40 MG capsule Take 40 mg by mouth every evening.    [provider]  SPIRIVA RESPIMAT 2.5 MCG/ACT AERS  07/19/17   [provider]  traMADol (ULTRAM) 50 MG tablet Take 1 tablet (50 mg total) by mouth every 6 (six) hours as needed. 07/05/17   Festus Aloe, MD  triamcinolone cream (KENALOG) 0.1 %  06/08/17   [provider]    Allergies    Oxycodone  Review of Systems   Review of Systems  Constitutional: Negative for appetite change and fatigue.  HENT: Negative for congestion, ear discharge and sinus pressure.   Eyes: Negative for discharge.  Respiratory: Positive for shortness of breath. Negative for cough.   Cardiovascular: Negative for chest pain.  Gastrointestinal: Negative for abdominal pain and diarrhea.  Genitourinary: Negative for frequency and hematuria.  Musculoskeletal: Negative for back pain.  Skin: Negative for rash.  Neurological: Negative for seizures and headaches.  Psychiatric/Behavioral: Negative for hallucinations.    Physical Exam Updated Vital Signs BP 109/66   Pulse 94   Temp 98.8 F (37.1 C) (Oral)   Resp (!) 26   Ht '5\' 11"'$  (1.803 m)   Wt 72.6 kg   SpO2 96%   BMI 22.32 kg/m   Physical Exam Vitals and nursing note reviewed.    Constitutional:      Appearance: He is well-developed.  HENT:     Head: Normocephalic.     Nose: Nose normal.  Eyes:     General: No scleral icterus.    Conjunctiva/sclera: Conjunctivae normal.  Neck:     Thyroid: No thyromegaly.  Cardiovascular:     Rate and Rhythm: Normal rate and regular rhythm.     Heart sounds: No murmur. No friction rub. No gallop.   Pulmonary:     Breath sounds: No stridor. Wheezing present. No rales.  Chest:     Chest wall: No tenderness.  Abdominal:     General: There is no distension.     Tenderness: There is no abdominal  tenderness. There is no rebound.  Genitourinary:    Comments: Rectal Brown stool heme positive Musculoskeletal:        General: Normal range of motion.     Cervical back: Neck supple.  Lymphadenopathy:     Cervical: No cervical adenopathy.  Skin:    Findings: No erythema or rash.  Neurological:     Mental Status: He is oriented to person, place, and time.     Motor: No abnormal muscle tone.     Coordination: Coordination normal.  Psychiatric:        Behavior: Behavior normal.     ED Results / Procedures / Treatments   Labs (all labs ordered are listed, but only abnormal results are displayed) Labs Reviewed  CBC WITH DIFFERENTIAL/PLATELET - Abnormal; Notable for the following components:      Result Value   RBC 3.25 (*)    Hemoglobin 7.7 (*)    HCT 26.2 (*)    MCH 23.7 (*)    MCHC 29.4 (*)    RDW 16.7 (*)    Platelets 417 (*)    All other components within normal limits  COMPREHENSIVE METABOLIC PANEL - Abnormal; Notable for the following components:   Glucose, Bld 107 (*)    Calcium 8.4 (*)    GFR calc non Af Amer 57 (*)    All other components within normal limits  RESPIRATORY PANEL BY RT PCR (FLU A&B, COVID)  TROPONIN I (HIGH SENSITIVITY)    EKG None  Radiology DG Chest Portable 1 View  Result Date: 03/28/2019 CLINICAL DATA:  Cough and shortness of breath for several months, prostate cancer, COPD,  smoker, COVID test pending EXAM: PORTABLE CHEST 1 VIEW COMPARISON:  Portable exam 1402 hours compared to 04/29/2017 FINDINGS: Normal heart size, mediastinal contours, and pulmonary vascularity. Atherosclerotic calcification aorta. Bibasilar atelectasis greater on RIGHT. No definite infiltrate, pleural effusion or pneumothorax. Bones demineralized with postsurgical changes of the inferior cervical spine. IMPRESSION: Bibasilar atelectasis greater on RIGHT. Electronically Signed   By: Lavonia Dana M.D.   On: 03/28/2019 14:20    Procedures Procedures (including critical care time)  Medications Ordered in ED Medications  albuterol (VENTOLIN HFA) 108 (90 Base) MCG/ACT inhaler 6 puff (6 puffs Inhalation Given 03/28/19 1408)  methylPREDNISolone sodium succinate (SOLU-MEDROL) 125 mg/2 mL injection 125 mg (125 mg Intravenous Given 03/28/19 1404)  magnesium sulfate IVPB 2 g 50 mL (2 g Intravenous New Bag/Given 03/28/19 1406)  aerochamber Z-Stat Plus/medium (1 each  Given 03/28/19 1408)    ED Course  I have reviewed the triage vital signs and the nursing notes.  Pertinent labs & imaging results that were available during my care of the patient were reviewed by me and considered in my medical decision making (see chart for details).    MDM Rules/Calculators/A&P                      Patient with COPD exacerbation.  Patient improved with albuterol.  Patient is very anemic and rectal exam was heme positive.  I recommend admission to the hospital for the anemia and the shortness of breath but patient refused.  He is referred to GI and back to his family doctor.  Patient left AMA Final Clinical Impression(s) / ED Diagnoses Final diagnoses:  None    Rx / DC Orders ED Discharge Orders    None       Milton Ferguson, MD 03/28/19 1524

## 2019-03-29 ENCOUNTER — Telehealth (INDEPENDENT_AMBULATORY_CARE_PROVIDER_SITE_OTHER): Payer: Self-pay | Admitting: Internal Medicine

## 2019-03-29 NOTE — Telephone Encounter (Signed)
Patient left voice mail message after hours stating he was bleeding from the rectum - stated he had left the ED and was told to contact a GI doctor - wanted to make an appointment - patient was called back - got no answer and voice mail was full - could not leave a message

## 2019-08-07 MED ORDER — ZOLPIDEM TARTRATE 5 MG PO TABS
5.00 | ORAL_TABLET | ORAL | Status: DC
Start: 2019-08-06 — End: 2019-08-07

## 2019-08-07 MED ORDER — UMECLIDINIUM BROMIDE 62.5 MCG/INH IN AEPB
1.00 | INHALATION_SPRAY | RESPIRATORY_TRACT | Status: DC
Start: 2019-08-07 — End: 2019-08-07

## 2019-08-07 MED ORDER — ALBUTEROL SULFATE (2.5 MG/3ML) 0.083% IN NEBU
2.50 | INHALATION_SOLUTION | RESPIRATORY_TRACT | Status: DC
Start: ? — End: 2019-08-07

## 2019-08-07 MED ORDER — NALOXONE HCL 4 MG/10ML IJ SOLN
0.10 | INTRAMUSCULAR | Status: DC
Start: ? — End: 2019-08-07

## 2019-08-07 MED ORDER — FUROSEMIDE 20 MG PO TABS
20.00 | ORAL_TABLET | ORAL | Status: DC
Start: 2019-08-07 — End: 2019-08-07

## 2019-08-07 MED ORDER — LORATADINE 10 MG PO TABS
10.00 | ORAL_TABLET | ORAL | Status: DC
Start: 2019-08-07 — End: 2019-08-07

## 2019-08-07 MED ORDER — GABAPENTIN 300 MG PO CAPS
300.00 | ORAL_CAPSULE | ORAL | Status: DC
Start: 2019-08-06 — End: 2019-08-07

## 2019-08-07 MED ORDER — FENTANYL 12 MCG/HR TD PT72
1.00 | MEDICATED_PATCH | TRANSDERMAL | Status: DC
Start: 2019-08-07 — End: 2019-08-07

## 2019-08-07 MED ORDER — ACETAMINOPHEN 325 MG PO TABS
650.00 | ORAL_TABLET | ORAL | Status: DC
Start: ? — End: 2019-08-07

## 2019-08-07 MED ORDER — FLUTICASONE FUROATE-VILANTEROL 100-25 MCG/INH IN AEPB
1.00 | INHALATION_SPRAY | RESPIRATORY_TRACT | Status: DC
Start: 2019-08-07 — End: 2019-08-07

## 2019-08-07 MED ORDER — POTASSIUM CHLORIDE CRYS ER 20 MEQ PO TBCR
20.00 | EXTENDED_RELEASE_TABLET | ORAL | Status: DC
Start: 2019-08-06 — End: 2019-08-07

## 2019-08-07 MED ORDER — TAMSULOSIN HCL 0.4 MG PO CAPS
0.40 | ORAL_CAPSULE | ORAL | Status: DC
Start: 2019-08-06 — End: 2019-08-07

## 2019-08-07 MED ORDER — ALPRAZOLAM 1 MG PO TABS
1.00 | ORAL_TABLET | ORAL | Status: DC
Start: ? — End: 2019-08-07

## 2019-08-07 MED ORDER — GUAIFENESIN 100 MG/5ML PO SYRP
200.00 | ORAL_SOLUTION | ORAL | Status: DC
Start: ? — End: 2019-08-07

## 2019-08-07 MED ORDER — DIPHENHYDRAMINE HCL 25 MG PO CAPS
50.00 | ORAL_CAPSULE | ORAL | Status: DC
Start: ? — End: 2019-08-07

## 2019-08-07 MED ORDER — DIPHENHYDRAMINE HCL 25 MG PO CAPS
25.00 | ORAL_CAPSULE | ORAL | Status: DC
Start: 2019-08-06 — End: 2019-08-07

## 2019-08-07 MED ORDER — MORPHINE SULFATE 10 MG/ML IJ SOLN
2.00 | INTRAMUSCULAR | Status: DC
Start: ? — End: 2019-08-07

## 2019-08-07 MED ORDER — SODIUM CHLORIDE 0.9 % IV SOLN
INTRAVENOUS | Status: DC
Start: ? — End: 2019-08-07

## 2019-08-07 MED ORDER — SODIUM CHLORIDE 0.9 % IV SOLN
100.00 | INTRAVENOUS | Status: DC
Start: ? — End: 2019-08-07

## 2019-08-07 MED ORDER — ONDANSETRON HCL 4 MG/2ML IJ SOLN
4.00 | INTRAMUSCULAR | Status: DC
Start: ? — End: 2019-08-07

## 2019-08-07 MED ORDER — DULOXETINE HCL 30 MG PO CPEP
60.00 | ORAL_CAPSULE | ORAL | Status: DC
Start: 2019-08-07 — End: 2019-08-07

## 2019-08-07 MED ORDER — ALBUTEROL SULFATE HFA 108 (90 BASE) MCG/ACT IN AERS
2.00 | INHALATION_SPRAY | RESPIRATORY_TRACT | Status: DC
Start: 2019-08-07 — End: 2019-08-07

## 2019-08-07 MED ORDER — DIPHENHYDRAMINE HCL 25 MG PO CAPS
25.00 | ORAL_CAPSULE | ORAL | Status: DC
Start: ? — End: 2019-08-07

## 2019-08-07 MED ORDER — CALCIUM CARBONATE 1250 (500 CA) MG PO CHEW
CHEWABLE_TABLET | ORAL | Status: DC
Start: ? — End: 2019-08-07

## 2019-08-07 MED ORDER — NICOTINE 21 MG/24HR TD PT24
1.00 | MEDICATED_PATCH | TRANSDERMAL | Status: DC
Start: ? — End: 2019-08-07

## 2019-08-08 ENCOUNTER — Telehealth: Payer: Self-pay | Admitting: Oncology

## 2019-08-08 NOTE — Telephone Encounter (Signed)
Received an urgent new pt referral from Dr. Junious Silk at [facility] for prostate cancer. Todd Keith has been scheduled to see Dr. Alen Blew on 6/3 at 2pm. Appt date and time was given to the pt's son who's aware to arrive 15 minutes early.

## 2019-08-09 ENCOUNTER — Other Ambulatory Visit: Payer: Self-pay | Admitting: *Deleted

## 2019-08-09 ENCOUNTER — Other Ambulatory Visit: Payer: Self-pay

## 2019-08-09 ENCOUNTER — Inpatient Hospital Stay: Payer: Medicare Other | Attending: Oncology | Admitting: Oncology

## 2019-08-09 VITALS — BP 111/72 | HR 101 | Temp 97.9°F | Resp 18 | Ht 71.0 in | Wt 141.3 lb

## 2019-08-09 DIAGNOSIS — C269 Malignant neoplasm of ill-defined sites within the digestive system: Secondary | ICD-10-CM

## 2019-08-09 NOTE — Progress Notes (Signed)
Reason for the request:    Abdominal malignancy  HPI: I was asked by Dr. Junious Silk   to evaluate Todd Keith for new findings of abdominal malignancy.  He is an 79 year old man with history of COPD as well as other comorbid conditions.  He was diagnosed with prostate cancer in 2018 and presented with a Gleason score 3+4 = 7 and a PSA of 5.91.  He had received definitive therapy with seed implants he has been on active surveillance by Dr. Junious Silk.  His last PSA in May 2020 was 0.58 and has been declining compared to 2019 was 1.56.  It was hospitalized on Aug 03, 2019 after presenting with abdominal pain and anemia. His evaluation at that time showed a hemoglobin of 6.8 with MCV of 71.7.  He received packed red cell transfusion with improvement in hemoglobin of 10.9.  CT scan of the abdomen and pelvis on Aug 03, 2019 showed omental caking with small volume ascites with multifocal omental nodularity throughout the entire abdomen.  Colonic neoplasm was suspected with 2 separate regions of masslike thickening of the sigmoid colon and the ascending colon.  He was discharged on June 1 and was evaluated by Dr. Junious Silk and was referred here for further evaluation.  Since his discharge, he continues to have issues with abdominal pain, constipation and has reported no hematochezia or melena.  He does report limited mobility at this time and currently staying with his daughter.  His performance status has been declining and of lost weight.  He does report shortness of breath and difficulty breathing and has continued to decline.    He does not report any headaches, blurry vision, syncope or seizures. Does not report any fevers, chills or sweats.  Does not report any cough, wheezing or hemoptysis.  Does not report any chest pain, palpitation, orthopnea or leg edema.  Does not report any skeletal complaints.    Does not report frequency, urgency or hematuria.  Does not report any skin rashes or lesions. Does not report  any heat or cold intolerance.  Does not report any lymphadenopathy or petechiae.  Does not report any anxiety or depression.  Remaining review of systems is negative.    Past Medical History:  Diagnosis Date  . Anxiety   . Arthritis    hips and back  . COPD (chronic obstructive pulmonary disease) ([place])   . Depression   . Dyspnea on exertion   . Floater, vitreous, left    post op cataract surgery 2017  . GERD (gastroesophageal reflux disease)   . Headache(784.0)   . History of retinal hemorrhage    post op cataract surgery left eye 2017 approx.,  resolved  . Mild sleep apnea    per pt had sleep study approx. was told had mild osa and no cpap recommended  . Neuropathy, peripheral    feet and up legs  . 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 Nocturia   . Prostate cancer [facility]) urologist-  dr eskridge/  oncologist-  dr Tammi Klippel   dx 11-09-2016  Stage T2a, Gleason 3+4, PSA 5.91, vol 32.93g-- scheduled for radioactive seed implants  . Wears glasses   :  Past Surgical History:  Procedure Laterality Date  . ANTERIOR CERVICAL DECOMP/DISCECTOMY FUSION N/A 06/05/2012   Procedure: Cervical six - seven  ANTERIOR CERVICAL DECOMPRESSION/DISCECTOMY FUSION 1 LEVEL REMOVAL OF HARDWARE Cervical four - six;  Surgeon: Charlie Pitter, MD;  Location: [place] NEURO ORS;  Service: Neurosurgery;  Laterality: N/A;  . CATARACT EXTRACTION  W/ INTRAOCULAR LENS IMPLANT Left 04/18/15  approx.  Marland Kitchen CATARACT EXTRACTION W/PHACO Right 05/15/2018   Procedure: CATARACT EXTRACTION PHACO AND INTRAOCULAR LENS PLACEMENT ([place]);  Surgeon: Baruch Goldmann, MD;  Location: [facility];  Service: Ophthalmology;  Laterality: Right;  CDE: 25.69  . CERVICAL DISC SURGERY  1990s and 2002-04-17  approx.   C4-6  . PROSTATE BIOPSY  11-09-2016   dr eskridge office  . RADIOACTIVE SEED IMPLANT N/A 07/05/2017   Procedure: RADIOACTIVE SEED IMPLANT/BRACHYTHERAPY IMPLANT;  Surgeon: Festus Aloe, MD;  Location: [facility];  Service: Urology;  Laterality: N/A;  ONLY  NEEDS 120 MIN FOR BOTH PROCEDURES  . SPACE OAR INSTILLATION N/A 07/05/2017   Procedure: SPACE OAR INSTILLATION;  Surgeon: Festus Aloe, MD;  [facility];  Service: Urology;  Laterality: N/A;  :   Current Outpatient Medications:  .  albuterol (PROAIR HFA) 108 (90 BASE) MCG/ACT inhaler, Inhale 2 puffs into the lungs every 4 (four) hours as needed. , Disp: , Rfl:  .  ALPRAZolam (XANAX) 1 MG tablet, Take 1 mg by mouth 2 (two) times daily., Disp: , Rfl:  .  cephALEXin (KEFLEX) 500 MG capsule, Take 1 capsule (500 mg total) by mouth 2 (two) times daily., Disp: 10 capsule, Rfl: 0 .  clonazePAM (KLONOPIN) 1 MG tablet, , Disp: , Rfl: 0 .  DULoxetine (CYMBALTA) 60 MG capsule, Take 60 mg by mouth every evening. , Disp: , Rfl:  .  eszopiclone (LUNESTA) 2 MG TABS tablet, Take 2 mg by mouth at bedtime. Take immediately before bedtime, Disp: , Rfl:  .  Fluticasone-Salmeterol (ADVAIR DISKUS) 250-50 MCG/DOSE AEPB, Inhale 1 puff into the lungs 2 (two) times daily. , Disp: , Rfl:  .  levocetirizine (XYZAL) 5 MG tablet, , Disp: , Rfl: 0 .  loratadine (CLARITIN) 10 MG tablet, Take 5 mg by mouth every evening. , Disp: , Rfl:  .  NARCAN 4 MG/0.1ML LIQD nasal spray kit, , Disp: , Rfl: 0 .  SPIRIVA RESPIMAT 2.5 MCG/ACT AERS, , Disp: , Rfl: 0 .  traMADol (ULTRAM) 50 MG tablet, Take 1 tablet (50 mg total) by mouth every 6 (six) hours as needed., Disp: 12 tablet, Rfl: 0 .  triamcinolone cream (KENALOG) 0.1 %, , Disp: , Rfl: :  Allergies  Allergen Reactions  . Oxycodone Itching  :  Family History  Problem Relation Age of Onset  . Cancer Mother        breast  . Cancer Father        unknown  :  Social History   Socioeconomic History  . Marital status: Widowed    Spouse name: Not on file  . Number of children: 3  . Years of education: Not on file  . Highest education level: Not on file  Occupational History  . Occupation: retired    Comment: Architect  Tobacco Use  .  Smoking status: Current Some Day Smoker    Years: 60.00    Types: Cigars  . Smokeless tobacco: Never Used  . Tobacco comment: down to 1-2 cigars per day (the small ones)  Substance and Sexual Activity  . Alcohol use: No  . Drug use: No  . Sexual activity: Never    Comment: wife died in April 18, 2015  Other Topics Concern  . Not on file  Social History Narrative   Worked in Architect. Retired now. Wife died 1 year ago.    Social Determinants of Health   Financial Resource Strain:   . Difficulty  of Paying Living Expenses:   Food Insecurity:   . Worried About Charity fundraiser in the Last Year:   . Arboriculturist in the Last Year:   Transportation Needs:   . Film/video editor (Medical):   Marland Kitchen Lack of Transportation (Non-Medical):   Physical Activity:   . Days of Exercise per Week:   . Minutes of Exercise per Session:   Stress:   . Feeling of Stress :   Social Connections:   . Frequency of Communication with Friends and Family:   . Frequency of Social Gatherings with Friends and Family:   . Attends Religious Services:   . Active Member of Clubs or Organizations:   . Attends Archivist Meetings:   Marland Kitchen Marital Status:   Intimate Partner Violence:   . Fear of Current or Ex-Partner:   . Emotionally Abused:   Marland Kitchen Physically Abused:   . Sexually Abused:   :    Exam: Blood pressure 111/72, pulse (!) 101, temperature 97.9 F (36.6 C), temperature source Temporal, resp. rate 18, height 5' 11" (1.803 m), weight 141 lb 4.8 oz (64.1 kg), SpO2 91 %.   ECOG 2   General appearance: Chronically ill-appearing.  In no distress. Head: atraumatic without any abnormalities. Eyes: conjunctivae/corneas clear. PERRL.  Sclera anicteric. Throat: lips, mucosa, and tongue normal; without oral thrush or ulcers. Resp: clear to auscultation bilaterally without rhonchi, wheezes or dullness to percussion. Cardio: regular rate and rhythm, S1, S2 normal, no murmur, click, rub or gallop GI:  Hard abdomen and distended. bowel sounds normal; no masses,  no organomegaly Skin: Skin color, texture, turgor normal. No rashes or lesions Lymph nodes: Cervical, supraclavicular, and axillary nodes normal. Neurologic: Grossly normal without any motor, sensory or deep tendon reflexes. Musculoskeletal: No joint deformity or effusion.    1. Findings consistent with abdominopelvic malignancy with omental caking and small volume ascites. Multifocal omental nodularity throughout the anterior abdomen and both pericolic gutters. Recommend correlation with malignancy history is well as recent imaging of performed elsewhere. 2. Potential primary malignancy includes colonic neoplasm with 2 separate regions of abnormality suspicious for neoplasm. There is abnormal masslike thickening of the sigmoid colon as well as apple-core type lesion in the ascending colon. Recommend further evaluation with colonoscopy. Additionally the appendix is abnormal with intraluminal soft tissue density and fluid proximally, potential appendiceal neoplasm. 3. Brachytherapy seeds in the prostate gland. Mild wall thickening at the bladder base, nonspecific. Recommend correlation with urinalysis. 4. Large volume colonic stool. Fecalization of distal small bowel contents consistent with slow transit. No bowel obstruction. 5. Colonic diverticulosis without diverticulitis. 6. Few calcifications in the pancreatic head suggestive of chronic pancreatitis. No obvious pancreatic mass or inflammatory change.  Aortic Atherosclerosis (ICD10-I70.0).  Assessment and Plan:    79 year old with:  1.  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  He presented with iron deficiency anemia dating back to at least January 2021 but worsened on Aug 03, 2019 that required transfusion.  Abdominal imaging showed widespread disease with peritoneal involvement arising from a likely colorectal primary.  Natural course of  this disease and treatment options were discussed at this time with the patient and his daughter.  The aggressive approach would be to obtain tissue biopsy via colonoscopy or peritoneal biopsy and based on these findings he would likely require palliative systemic chemotherapy.  Chemotherapy will likely offer very little palliation given his overall declining performance status and the extent of his disease.  Side  effects associated with chemotherapy were reviewed today especially Xeloda or 5-FU based therapy with nausea, vomiting, myelosuppression, neutropenia and possible sepsis.  Alternative option would be to proceed with hospice which is his preference at this time.  He has been offered hospice during his recent hospitalization and he is agreeable at this time.  He understands he has an incurable malignancy and disease and will likely will take over his body rather quickly.  Chemotherapy my offer temporary palliation but he has declined at this time.  I will make the appropriate referral to hospice at this time where he can be followed up locally.  2.  Anemia: Related to his GI blood losses and he received transfusion.  3.  Chronic pain: He has been followed at the pain clinic previously and has been receiving pain medication there.  I will defer pain management at this time to hospice services with correlation with his pain management physicians.  4.  Prostate cancer: He is status post seed implants and brachytherapy.  This does not appear to be his issue at this time.  5.  Follow-up: Will be as needed and will rely on hospice for updates.  I will arrange oncology follow-up close to home if needed in the future.   60  minutes were dedicated to this visit. The time was spent on reviewing laboratory data, imaging studies, discussing treatment options, discussing differential diagnosis and answering questions regarding future plan.    A copy of this consult has been forwarded to the requesting  physician.

## 2019-08-09 NOTE — Progress Notes (Signed)
[place], started referral.

## 2019-08-11 ENCOUNTER — Encounter (HOSPITAL_COMMUNITY): Payer: Self-pay

## 2019-08-11 ENCOUNTER — Emergency Department (HOSPITAL_COMMUNITY): Payer: Medicare Other

## 2019-08-11 ENCOUNTER — Other Ambulatory Visit: Payer: Self-pay

## 2019-08-11 ENCOUNTER — Emergency Department (HOSPITAL_COMMUNITY)
Admission: EM | Admit: 2019-08-11 | Discharge: 2019-08-11 | Disposition: A | Discharge status: Home / Self Care | Payer: Medicare Other | Attending: Emergency Medicine | Admitting: Emergency Medicine

## 2019-08-11 DIAGNOSIS — F17290 Nicotine dependence, other tobacco product, uncomplicated: Secondary | ICD-10-CM | POA: Diagnosis not present

## 2019-08-11 DIAGNOSIS — Z79899 Other long term (current) drug therapy: Secondary | ICD-10-CM | POA: Insufficient documentation

## 2019-08-11 DIAGNOSIS — Z792 Long term (current) use of antibiotics: Secondary | ICD-10-CM | POA: Insufficient documentation

## 2019-08-11 DIAGNOSIS — K5669 Other partial intestinal obstruction: Secondary | ICD-10-CM | POA: Insufficient documentation

## 2019-08-11 DIAGNOSIS — K56609 Unspecified intestinal obstruction, unspecified as to partial versus complete obstruction: Secondary | ICD-10-CM

## 2019-08-11 DIAGNOSIS — C19 Malignant neoplasm of rectosigmoid junction: Secondary | ICD-10-CM

## 2019-08-11 DIAGNOSIS — C189 Malignant neoplasm of colon, unspecified: Secondary | ICD-10-CM | POA: Insufficient documentation

## 2019-08-11 DIAGNOSIS — Z8546 Personal history of malignant neoplasm of prostate: Secondary | ICD-10-CM | POA: Diagnosis not present

## 2019-08-11 DIAGNOSIS — Z886 Allergy status to analgesic agent status: Secondary | ICD-10-CM | POA: Insufficient documentation

## 2019-08-11 DIAGNOSIS — J449 Chronic obstructive pulmonary disease, unspecified: Secondary | ICD-10-CM | POA: Insufficient documentation

## 2019-08-11 DIAGNOSIS — Z20822 Contact with and (suspected) exposure to covid-19: Secondary | ICD-10-CM | POA: Insufficient documentation

## 2019-08-11 DIAGNOSIS — R0682 Tachypnea, not elsewhere classified: Secondary | ICD-10-CM | POA: Insufficient documentation

## 2019-08-11 DIAGNOSIS — R109 Unspecified abdominal pain: Secondary | ICD-10-CM | POA: Diagnosis present

## 2019-08-11 LAB — CBC WITH DIFFERENTIAL/PLATELET
Abs Immature Granulocytes: 0.06 10*3/uL (ref 0.00–0.07)
Basophils Absolute: 0 10*3/uL (ref 0.0–0.1)
Basophils Relative: 0 %
Eosinophils Absolute: 0.3 10*3/uL (ref 0.0–0.5)
Eosinophils Relative: 2 %
HCT: 40.2 % (ref 39.0–52.0)
Hemoglobin: 12.2 g/dL — ABNORMAL LOW (ref 13.0–17.0)
Immature Granulocytes: 1 %
Lymphocytes Relative: 14 %
Lymphs Abs: 1.7 10*3/uL (ref 0.7–4.0)
MCH: 23.6 pg — ABNORMAL LOW (ref 26.0–34.0)
MCHC: 30.3 g/dL (ref 30.0–36.0)
MCV: 77.8 fL — ABNORMAL LOW (ref 80.0–100.0)
Monocytes Absolute: 1 10*3/uL (ref 0.1–1.0)
Monocytes Relative: 8 %
Neutro Abs: 9.7 10*3/uL — ABNORMAL HIGH (ref 1.7–7.7)
Neutrophils Relative %: 75 %
Platelets: 520 10*3/uL — ABNORMAL HIGH (ref 150–400)
RBC: 5.17 MIL/uL (ref 4.22–5.81)
RDW: 23.8 % — ABNORMAL HIGH (ref 11.5–15.5)
WBC: 12.8 10*3/uL — ABNORMAL HIGH (ref 4.0–10.5)
nRBC: 0 % (ref 0.0–0.2)

## 2019-08-11 LAB — COMPREHENSIVE METABOLIC PANEL
ALT: 11 U/L (ref 0–44)
AST: 19 U/L (ref 15–41)
Albumin: 3.6 g/dL (ref 3.5–5.0)
Alkaline Phosphatase: 61 U/L (ref 38–126)
Anion gap: 14 (ref 5–15)
BUN: 14 mg/dL (ref 8–23)
CO2: 26 mmol/L (ref 22–32)
Calcium: 8.5 mg/dL — ABNORMAL LOW (ref 8.9–10.3)
Chloride: 97 mmol/L — ABNORMAL LOW (ref 98–111)
Creatinine, Ser: 1.3 mg/dL — ABNORMAL HIGH (ref 0.61–1.24)
GFR calc Af Amer: 59 mL/min — ABNORMAL LOW (ref >60)
GFR calc non Af Amer: 51 mL/min — ABNORMAL LOW (ref >60)
Glucose, Bld: 155 mg/dL — ABNORMAL HIGH (ref 70–99)
Potassium: 2.8 mmol/L — ABNORMAL LOW (ref 3.5–5.1)
Sodium: 137 mmol/L (ref 135–145)
Total Bilirubin: 0.7 mg/dL (ref 0.3–1.2)
Total Protein: 7.3 g/dL (ref 6.5–8.1)

## 2019-08-11 LAB — SARS CORONAVIRUS 2 BY RT PCR (HOSPITAL ORDER, PERFORMED IN ~~LOC~~ HOSPITAL LAB): SARS Coronavirus 2: NEGATIVE

## 2019-08-11 LAB — LIPASE, BLOOD: Lipase: 24 U/L (ref 11–51)

## 2019-08-11 MED ORDER — IOHEXOL 300 MG/ML  SOLN: 75.0000 mL | Freq: Once | INTRAMUSCULAR | Status: DC | PRN

## 2019-08-11 MED ORDER — POTASSIUM CHLORIDE 10 MEQ/100ML IV SOLN
10.0000 meq | INTRAVENOUS | Status: AC
  Administered 2019-08-11 (×2): 10 meq via INTRAVENOUS
  Filled 2019-08-11 (×2): qty 100

## 2019-08-11 MED ORDER — SODIUM CHLORIDE 0.9 % IV SOLN: Freq: Once | INTRAVENOUS | Status: AC

## 2019-08-11 MED ORDER — IOHEXOL 300 MG/ML  SOLN
75.0000 mL | Freq: Once | INTRAMUSCULAR | Status: AC | PRN
  Administered 2019-08-11: 75 mL via INTRAVENOUS

## 2019-08-11 MED ORDER — ONDANSETRON HCL 4 MG/2ML IJ SOLN
4.0000 mg | Freq: Once | INTRAMUSCULAR | Status: AC
  Administered 2019-08-11: 4 mg via INTRAVENOUS
  Filled 2019-08-11: qty 2

## 2019-08-11 MED ORDER — HYDROMORPHONE HCL 1 MG/ML IJ SOLN
1.0000 mg | Freq: Once | INTRAMUSCULAR | Status: AC
  Administered 2019-08-11: 1 mg via INTRAVENOUS
  Filled 2019-08-11: qty 1

## 2019-08-11 MED ORDER — ONDANSETRON 4 MG PO TBDP: 4.0000 mg | ORAL_TABLET | Freq: Three times a day (TID) | ORAL | 0 refills | Status: AC | PRN

## 2019-08-11 MED ORDER — SODIUM CHLORIDE 0.9 % IV BOLUS
500.0000 mL | Freq: Once | INTRAVENOUS | Status: AC
  Administered 2019-08-11: 500 mL via INTRAVENOUS

## 2019-08-11 MED ORDER — MORPHINE SULFATE 20 MG/5ML PO SOLN: 5.0000 mg | ORAL | 0 refills | Status: AC | PRN

## 2019-08-11 NOTE — ED Notes (Signed)
Dr Manuella Ghazi in with pts.

## 2019-08-11 NOTE — ED Notes (Addendum)
Pt is ready for discharge   Call by pt to his spouse who is not answering the phone   Call to daughter, Lattie Haw, by this nurse [phone]  No answer and no voice mail   Call to niece Arlyce Dice [phone] with no answer    Call to Madilyn Fireman, LCSW for guidance with message left  Call to son Dominica Severin [phone] Message left

## 2019-08-11 NOTE — ED Notes (Signed)
ED Provider at bedside with pts daughter.

## 2019-08-11 NOTE — Discharge Instructions (Signed)
He was seen in the emergency room today with abdominal pain.  Your colon cancer is causing some narrowing in your colon which is causing your discomfort.  Please continue to drink plenty of fluids.  Please keep your appointment with your hospice team.  I have called in a prescription for morphine solution as well as Zofran to take as needed for severe pain.  Return to the emergency department any new or suddenly worsening symptoms.

## 2019-08-11 NOTE — ED Provider Notes (Signed)
[facility] EMERGENCY DEPARTMENT Provider Note   CSN: [id number] Arrival date & time: 08/11/19  0231     History Chief Complaint  Patient presents with  . Shortness of Breath  . Abdominal Pain    HOBY KAWAI is a 79 y.o. male.  Patient presents to the emergency department for evaluation of severe abdominal pain.  Patient reports that he has colon cancer and was told he only has a few days to live.  He reports that he has been experiencing progressively worsening abdominal pain and distention.  He feels constipated, has not had a bowel movement in 1 week.        Past Medical History:  Diagnosis Date  . Anxiety   . Arthritis    hips and back  . COPD (chronic obstructive pulmonary disease) ([place])   . Depression   . Dyspnea on exertion   . Floater, vitreous, left    post op cataract surgery 2017  . GERD (gastroesophageal reflux disease)   . Headache(784.0)   . History of retinal hemorrhage    post op cataract surgery left eye 2017 approx.,  resolved  . Mild sleep apnea    per pt had sleep study approx. was told had mild osa and no cpap recommended  . Neuropathy, peripheral    feet and up legs  . 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 Nocturia   . Prostate cancer [facility]) urologist-  dr eskridge/  oncologist-  dr Tammi Klippel   dx 11-09-2016  Stage T2a, Gleason 3+4, PSA 5.91, vol 32.93g-- scheduled for radioactive seed implants  . Wears glasses     Patient Active Problem List   Diagnosis Date Noted  . Prostate cancer ([place]) 07/05/2017  . Malignant neoplasm of prostate ([place]) 01/19/2017  . Cervical spondylosis without myelopathy 06/05/2012    Past Surgical History:  Procedure Laterality Date  . ANTERIOR CERVICAL DECOMP/DISCECTOMY FUSION N/A 06/05/2012   Procedure: Cervical six - seven  ANTERIOR CERVICAL DECOMPRESSION/DISCECTOMY FUSION 1 LEVEL REMOVAL OF HARDWARE Cervical four - six;  Surgeon: Charlie Pitter, MD;  Location: [place] [facility];  Service: Neurosurgery;  Laterality: N/A;  . CATARACT  EXTRACTION W/ INTRAOCULAR LENS IMPLANT Left 2017  approx.  Marland Kitchen CATARACT EXTRACTION W/PHACO Right 05/15/2018   Procedure: CATARACT EXTRACTION PHACO AND INTRAOCULAR LENS PLACEMENT ([place]);  Surgeon: Baruch Goldmann, MD;  Location: [facility];  Service: Ophthalmology;  Laterality: Right;  CDE: 25.69  . CERVICAL DISC SURGERY  1990s and 2004  approx.   C4-6  . PROSTATE BIOPSY  11-09-2016   dr eskridge office  . RADIOACTIVE SEED IMPLANT N/A 07/05/2017   Procedure: RADIOACTIVE SEED IMPLANT/BRACHYTHERAPY IMPLANT;  Surgeon: Festus Aloe, MD;  Location: [facility];  Service: Urology;  Laterality: N/A;  ONLY NEEDS 120 MIN FOR BOTH PROCEDURES  . SPACE OAR INSTILLATION N/A 07/05/2017   Procedure: SPACE OAR INSTILLATION;  Surgeon: Festus Aloe, MD;  Location: [facility];  Service: Urology;  Laterality: N/A;       Family History  Problem Relation Age of Onset  . Cancer Mother        breast  . Cancer Father        unknown    Social History   Tobacco Use  . Smoking status: Current Some Day Smoker    Years: 60.00    Types: Cigars  . Smokeless tobacco: Never Used  . Tobacco comment: down to 1-2 cigars per day (the small ones)  Substance Use Topics  . Alcohol use:  No  . Drug use: No    Home Medications Prior to Admission medications   Medication Sig Start Date End Date Taking? Authorizing Provider  albuterol (PROAIR HFA) 108 (90 BASE) MCG/ACT inhaler Inhale 2 puffs into the lungs every 4 (four) hours as needed.     [provider]  ALPRAZolam Duanne Moron) 1 MG tablet Take 1 mg by mouth 2 (two) times daily.    [provider]  cephALEXin (KEFLEX) 500 MG capsule Take 1 capsule (500 mg total) by mouth 2 (two) times daily. 07/05/17   Festus Aloe, MD  clonazePAM Bobbye Charleston) 1 MG tablet  07/20/17   [provider]  DULoxetine (CYMBALTA) 60 MG capsule Take 60 mg by mouth every evening.     [provider]  eszopiclone (LUNESTA) 2 MG TABS  tablet Take 2 mg by mouth at bedtime. Take immediately before bedtime    [provider]  Fluticasone-Salmeterol (ADVAIR DISKUS) 250-50 MCG/DOSE AEPB Inhale 1 puff into the lungs 2 (two) times daily.  10/08/14   [provider]  levocetirizine (XYZAL) 5 MG tablet  07/16/17   [provider]  loratadine (CLARITIN) 10 MG tablet Take 5 mg by mouth every evening.     [provider]  NARCAN 4 MG/0.1ML LIQD nasal spray kit  07/20/17   [provider]  SPIRIVA RESPIMAT 2.5 MCG/ACT AERS  07/19/17   [provider]  traMADol (ULTRAM) 50 MG tablet Take 1 tablet (50 mg total) by mouth every 6 (six) hours as needed. 07/05/17   Festus Aloe, MD  triamcinolone cream (KENALOG) 0.1 %  06/08/17   [provider]    Allergies    Oxycodone  Review of Systems   Review of Systems  Gastrointestinal: Positive for abdominal distention, abdominal pain and constipation.  All other systems reviewed and are negative.   Physical Exam Updated Vital Signs BP 103/62   Pulse 86   Temp 98.6 F (37 C) (Oral)   Resp 19   Ht _0  (1.803 m)   Wt 64 kg   SpO2 (!) 89%   BMI 19.67 kg/m   Physical Exam Vitals and nursing note reviewed.  Constitutional:      General: He is not in acute distress.    Appearance: Normal appearance. He is well-developed.  HENT:     Head: Normocephalic and atraumatic.     Right Ear: Hearing normal.     Left Ear: Hearing normal.     Nose: Nose normal.  Eyes:     Conjunctiva/sclera: Conjunctivae normal.     Pupils: Pupils are equal, round, and reactive to light.  Cardiovascular:     Rate and Rhythm: Regular rhythm.     Heart sounds: S1 normal and S2 normal. No murmur. No friction rub. No gallop.   Pulmonary:     Effort: Pulmonary effort is normal. Tachypnea present. No respiratory distress.     Breath sounds: Normal breath sounds.  Chest:     Chest wall: No tenderness.  Abdominal:     General: Bowel sounds are  decreased. There is distension.     Tenderness: There is generalized abdominal tenderness. There is no guarding or rebound. Negative signs include Murphy's sign and McBurney's sign.     Hernia: No hernia is present.  Musculoskeletal:        General: Normal range of motion.     Cervical back: Normal range of motion and neck supple.  Skin:    General: Skin is warm and  dry.     Findings: No rash.  Neurological:     Mental Status: He is alert and oriented to person, place, and time.     GCS: GCS eye subscore is 4. GCS verbal subscore is 5. GCS motor subscore is 6.     Cranial Nerves: No cranial nerve deficit.     Sensory: No sensory deficit.     Coordination: Coordination normal.  Psychiatric:        Speech: Speech normal.        Behavior: Behavior normal.        Thought Content: Thought content normal.     ED Results / Procedures / Treatments   Labs (all labs ordered are listed, but only abnormal results are displayed) Labs Reviewed  CBC WITH DIFFERENTIAL/PLATELET - Abnormal; Notable for the following components:      Result Value   WBC 12.8 (*)    Hemoglobin 12.2 (*)    MCV 77.8 (*)    MCH 23.6 (*)    RDW 23.8 (*)    Platelets 520 (*)    Neutro Abs 9.7 (*)    All other components within normal limits  COMPREHENSIVE METABOLIC PANEL - Abnormal; Notable for the following components:   Potassium 2.8 (*)    Chloride 97 (*)    Glucose, Bld 155 (*)    Creatinine, Ser 1.30 (*)    Calcium 8.5 (*)    GFR calc non Af Amer 51 (*)    GFR calc Af Amer 59 (*)    All other components within normal limits  SARS CORONAVIRUS 2 BY RT PCR [facility] ORDER, [place] [facility])  LIPASE, BLOOD    EKG EKG Interpretation  Date/Time:  Saturday August 11 2019 02:48:10 EDT Ventricular Rate:  92 PR Interval:    QRS Duration: 94 QT Interval:  359 QTC Calculation: 440 R Axis:   73 Text Interpretation: Sinus rhythm Atrial premature complexes Borderline low voltage, extremity  leads No significant change since last tracing Confirmed by Orpah Greek [phone]) on 08/11/2019 3:04:44 AM   Radiology CT ABDOMEN PELVIS W CONTRAST  Result Date: 08/11/2019 CLINICAL DATA:  Abdominal distension. Bowel obstruction suspected. Patient reportedly has multiple types of cancer. EXAM: CT ABDOMEN AND PELVIS WITH CONTRAST TECHNIQUE: Multidetector CT imaging of the abdomen and pelvis was performed using the standard protocol following bolus administration of intravenous contrast. CONTRAST:  53m OMNIPAQUE IOHEXOL 300 MG/ML  SOLN COMPARISON:  CT, 07/26/2019. FINDINGS: Lower chest: Right greater than left lower lobe lung base opacity, consistent with atelectasis and similar to the prior exam. No acute findings. Hepatobiliary: No focal liver abnormality is seen. No gallstones, gallbladder wall thickening, or biliary dilatation. Pancreas: Unremarkable. No pancreatic ductal dilatation or surrounding inflammatory changes. Spleen: Normal in size without focal abnormality. Adrenals/Urinary Tract: No adrenal masses. Kidneys normal in overall size, orientation and position with symmetric enhancement and excretion. Bilateral renal cysts. No stones. No hydronephrosis. Normal ureters. Bladder is unremarkable. Stomach/Bowel: Colon is distended to a maximum of 7.6 cm, right transverse. There are multiple small bubbles of air within the colonic wall reflecting pneumatosis intestinalis, without wall thickening. In the distal sigmoid colon, there is an irregular area of bowel wall thickening and luminal narrowing with the sigmoid colon distal to this being decompressed. In the ascending colon there is a short segment of irregular wall thickening and luminal narrowing. There are scattered colonic diverticula without evidence of diverticulitis. Stomach is unremarkable.  Normal small bowel. There are multiple  omental and mesenteric masses/nodules, largest along the right peritoneal cavity, largest discrete nodule  measuring 3.4 cm. The largest nodule on the left measures 2.7 cm. These are similar to the prior CT. Vascular/Lymphatic: Aortic atherosclerosis.  No aneurysm. No enlarged lymph nodes. Reproductive: Multiple radiation therapy seeds along the prostate and prostate bed, stable. Other: Small amount of ascites increased compared to the prior CT. Musculoskeletal: No fracture or acute finding. No osteoblastic or osteolytic lesions. IMPRESSION: 1. Omental and peritoneal metastatic disease, which is similar to the prior CT. 2. Partial distal colonic obstruction due to irregular wall thickening and luminal narrowing of the distal sigmoid colon, which is similar to the prior CT, suspicious for colonic malignancy. There is also a short segment focus of colonic wall thickening and luminal narrowing of the ascending colon, also similar to the prior CT, and suspicious for a second focus of colon carcinoma. 3. Mild right colon pneumatosis intestinalis without wall thickening. No portal venous air. 4. Small amount of ascites that has increased in amount from the previous CT. Electronically Signed   By: Lajean Manes M.D.   On: 08/11/2019 06:48   DG Chest Port 1 View  Result Date: 08/11/2019 CLINICAL DATA:  Per triage note: Pt has multiple types of Cancer, has been constipated for several days and having severe stomach pain. Tonight the pain worsened and he is now also c/o sob, possibly r/t the pain. Pt states no BM x 5 days EXAM: PORTABLE CHEST 1 VIEW COMPARISON:  04/25/2019 FINDINGS: Cardiac silhouette is normal in size. No mediastinal or hilar masses. Low lung volumes. There is opacity at the lung bases, right greater than left, consistent with atelectasis and similar to the prior exam. Remainder of the lungs is clear. No convincing pleural effusion and no pneumothorax. Skeletal structures are demineralized but grossly intact. IMPRESSION: 1. No acute findings. 2. Lung base opacity, right greater than left, consistent with  atelectasis and similar to the prior radiographs. Electronically Signed   By: Lajean Manes M.D.   On: 08/11/2019 06:49    Procedures Procedures (including critical care time)  Medications Ordered in ED Medications  sodium chloride 0.9 % bolus 500 mL (0 mLs Intravenous Stopped 08/11/19 0446)  HYDROmorphone (DILAUDID) injection 1 mg (1 mg Intravenous Given 08/11/19 0333)  ondansetron (ZOFRAN) injection 4 mg (4 mg Intravenous Given 08/11/19 0333)  HYDROmorphone (DILAUDID) injection 1 mg (1 mg Intravenous Given 08/11/19 0439)  iohexol (OMNIPAQUE) 300 MG/ML solution 75 mL (75 mLs Intravenous Contrast Given 08/11/19 0504)    ED Course  I have reviewed the triage vital signs and the nursing notes.  Pertinent labs & imaging results that were available during my care of the patient were reviewed by me and considered in my medical decision making (see chart for details).    MDM Rules/Calculators/A&P                      Presented to the emergency department thinking that he was constipated.  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  I suspected blockage secondary to tumor.  CT scan was performed and it does show at least partial large bowel obstruction secondary to tumor burden.  I did have a conversation with the patient and his daughter.  They seem to be upset that their oncologist that they talked to told them that there was nothing that could be done.  I did have a lengthy conversation with them  stating that he did have incurable cancer.  The plan would be to at least discuss this with general surgery to see if there is some type of procedure that would allow him to be more comfortable with this partial blockage.  He does not appear to be a good surgical candidate and I did tell him that it is unlikely that there would be any procedures possible.  Awaiting callback from surgery.  Family and patient were counseled that he likely is best served with  palliative care.  I did explain to them that the best treatment plan would be to admit him to the hospital at least briefly to get his pain under control and that have a better pain control plan for discharge.  Will sign out to the oncoming ER physician to consult surgery and then likely get patient admitted for palliative care.  Final Clinical Impression(s) / ED Diagnoses Final diagnoses:  Colonic obstruction [facility])    Rx / DC Orders ED Discharge Orders    None       Ericah Scotto, Gwenyth Allegra, MD 08/11/19 [id number]

## 2019-08-11 NOTE — Consult Note (Signed)
Medical Consultation   Todd Keith  AGT:[id number]  DOB: [birth year]  DOA: 08/11/2019  PCP: [facility]   Outpatient Specialists: Dr. Quintella Baton onc   Requesting physician: Dr. Laverta Baltimore  Reason for consultation: Admission for pain management  History of Present Illness: Todd Keith is an 79 y.o. male with history of prostate cancer as well as current colorectal cancer with metastatic disease as well as COPD who presented to the ED with complaints of worsening abdominal pain and distention.  He apparently has not had a bowel movement in the last 1 week.  He was evaluated with a CT scan in the emergency department with demonstrated distal colonic obstruction.  EDP had discussed with Dr. Constance Haw of general surgery regarding this finding and he does not appear to be a candidate for surgery.  He states that his pain is a 9/10, but overall appears clinically comfortable and has received some Dilaudid in the ED.  He apparently is being set up for hospice at home care on Tuesday of next week as he has been told that he does not have very long to survive by his oncologist.  He has recently declined any palliative chemotherapy and understands that he has an incurable malignancy.  He declines any nausea or vomiting at this time.  No chest pain or shortness of breath or other concerns noted aside from the abdominal distention and pain.   Review of Systems:  ROS As per HPI otherwise 10 point review of systems negative.    Past Medical History: Past Medical History:  Diagnosis Date   Anxiety    Arthritis    hips and back   COPD (chronic obstructive pulmonary disease) (HCC)    Depression    Dyspnea on exertion    Floater, vitreous, left    post op cataract surgery 2017   GERD (gastroesophageal reflux disease)    Headache(784.0)    History of retinal hemorrhage    post op cataract surgery left eye 2017 approx.,  resolved   Mild sleep apnea    per  pt had sleep study approx. 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    Nocturia    Prostate cancer [facility]) urologist-  dr eskridge/  oncologist-  dr Tammi Klippel   dx 11-09-2016  Stage T2a, Gleason 3+4, PSA 5.91, vol 32.93g-- scheduled for radioactive seed implants   Wears glasses     Past Surgical History: Past Surgical History:  Procedure Laterality Date   ANTERIOR CERVICAL DECOMP/DISCECTOMY FUSION N/A 06/05/2012   Procedure: Cervical six - seven  ANTERIOR CERVICAL DECOMPRESSION/DISCECTOMY FUSION 1 LEVEL REMOVAL OF HARDWARE Cervical four - six;  Surgeon: Charlie Pitter, MD;  Location: [facility];  Service: Neurosurgery;  Laterality: N/A;   CATARACT EXTRACTION W/ INTRAOCULAR LENS IMPLANT Left 2017  approx.   CATARACT EXTRACTION W/PHACO Right 05/15/2018   Procedure: CATARACT EXTRACTION PHACO AND INTRAOCULAR LENS PLACEMENT ([place]);  Surgeon: Baruch Goldmann, MD;  Location: [facility];  Service: Ophthalmology;  Laterality: Right;  CDE: 25.69   CERVICAL DISC SURGERY  1990s and 2004  approx.   C4-6   PROSTATE BIOPSY  11-09-2016   dr eskridge office   RADIOACTIVE SEED IMPLANT N/A 07/05/2017   Procedure: RADIOACTIVE SEED IMPLANT/BRACHYTHERAPY IMPLANT;  Surgeon: Festus Aloe, MD;  Location: [facility];  Service: Urology;  Laterality: N/A;  ONLY NEEDS 120 MIN FOR BOTH PROCEDURES   SPACE OAR INSTILLATION N/A 07/05/2017   Procedure: SPACE OAR INSTILLATION;  Surgeon: Festus Aloe, MD;  Location: [facility];  Service: Urology;  Laterality: N/A;     Allergies:   Allergies  Allergen Reactions   Oxycodone Itching     Social History:  reports that he has been smoking cigars. He has smoked for the past 60.00 years. He has never used smokeless tobacco. He reports that he does not drink alcohol or use drugs.   Family History: Family History  Problem Relation Age of Onset   Cancer Mother          breast   Cancer Father        unknown    Physical Exam: Vitals:   08/11/19 0430 08/11/19 0630 08/11/19 0730 08/11/19 0800  BP: 113/63 103/62 121/87 113/67  Pulse: 88 86 92 92  Resp: 19  15 18   Temp:   98.2 F (36.8 C)   TempSrc:   Oral   SpO2: 94% (!) 89% 92% 95%  Weight:      Height:        Constitutional:   Alert and awake, oriented x3, not in any acute distress. Disheveled appearance Eyes: PERLA, EOMI, irises appear normal, anicteric sclera,  ENMT: external ears and nose appear normal,             Lips appears normal, oropharynx mucosa, tongue, posterior pharynx appear normal  Neck: neck appears normal, no masses, normal ROM, no thyromegaly, no JVD  CVS: S1-S2 clear, no murmur rubs or gallops, no LE edema, normal pedal pulses  Respiratory:  clear to auscultation bilaterally, no wheezing, rales or rhonchi. Respiratory effort normal. No accessory muscle use.  Abdomen: soft nontender, distended, normal bowel sounds, no hepatosplenomegaly, no hernias  Musculoskeletal: : no cyanosis, clubbing or edema noted bilaterally Neuro: Cranial nerves II-XII intact, strength, sensation, reflexes Psych: judgement and insight appear normal, stable mood and affect, mental status Skin: no rashes or lesions or ulcers, no induration or nodules   Data reviewed:  I have personally reviewed following labs and imaging studies Labs:  CBC: Recent Labs  Lab 08/11/19 0327  WBC 12.8*  NEUTROABS 9.7*  HGB 12.2*  HCT 40.2  MCV 77.8*  PLT 520*    Basic Metabolic Panel: Recent Labs  Lab 08/11/19 0327  NA 137  K 2.8*  CL 97*  CO2 26  GLUCOSE 155*  BUN 14  CREATININE 1.30*  CALCIUM 8.5*   GFR Estimated Creatinine Clearance: 40.3 mL/min (A) (by C-G formula based on SCr of 1.3 mg/dL (H)). Liver Function Tests: Recent Labs  Lab 08/11/19 0327  AST 19  ALT 11  ALKPHOS 61  BILITOT 0.7  PROT 7.3  ALBUMIN 3.6   Recent Labs  Lab 08/11/19 0327  LIPASE 24   No results for  input(s): AMMONIA in the last 168 hours. Coagulation profile No results for input(s): INR, PROTIME in the last 168 hours.  Cardiac Enzymes: No results for input(s): CKTOTAL, CKMB, CKMBINDEX, TROPONINI in the last 168 hours. BNP: Invalid input(s): POCBNP CBG: No results for input(s): GLUCAP in the last 168 hours. D-Dimer No results for input(s): DDIMER in the last 72 hours. Hgb A1c No results for input(s): HGBA1C in the last 72 hours. Lipid Profile No results for input(s): CHOL, HDL, LDLCALC, TRIG, CHOLHDL, LDLDIRECT in the last 72 hours. Thyroid function studies No results for input(s): TSH, T4TOTAL, T3FREE,  THYROIDAB in the last 72 hours.  Invalid input(s): FREET3 Anemia work up No results for input(s): VITAMINB12, FOLATE, FERRITIN, TIBC, IRON, RETICCTPCT in the last 72 hours. Urinalysis No results found for: COLORURINE, APPEARANCEUR, [place], [place], [place], [place], [place], KETONESUR, PROTEINUR, UROBILINOGEN, NITRITE, [place]   Microbiology Recent Results (from the past 240 hour(s))  SARS Coronavirus 2 by RT PCR (hospital order, performed in [facility] hospital lab) Nasopharyngeal Nasopharyngeal Swab     Status: None   Collection Time: 08/11/19  7:08 AM   Specimen: Nasopharyngeal Swab  Result Value Ref Range Status   SARS Coronavirus 2 NEGATIVE NEGATIVE Final    Comment: (NOTE) SARS-CoV-2 target nucleic acids are NOT DETECTED. The SARS-CoV-2 RNA is generally detectable in upper and lower respiratory specimens during the acute phase of infection. The lowest concentration of SARS-CoV-2 viral copies this assay can detect is 250 copies / mL. A negative result does not preclude SARS-CoV-2 infection and should not be used as the sole basis for treatment or other patient management decisions.  A negative result may occur with improper specimen collection / handling, submission of specimen other than nasopharyngeal swab, presence of viral mutation(s) within the areas  targeted by this assay, and inadequate number of viral copies (<250 copies / mL). A negative result must be combined with clinical observations, patient history, and epidemiological information. Fact Sheet for Patients:   StrictlyIdeas.no Fact Sheet for Healthcare Providers: BankingDealers.co.za This test is not yet approved or cleared  by the [place] FDA and has been authorized for detection and/or diagnosis of SARS-CoV-2 by FDA under an Emergency Use Authorization (EUA).  This EUA will remain in effect (meaning this test can be used) for the duration of the COVID-19 declaration under Section 564(b)(1) of the Act, 21 U.S.C. section 360bbb-3(b)(1), unless the authorization is terminated or revoked sooner. Performed at [facility], [address], [place],  [address]        Inpatient Medications:   Scheduled Meds: Continuous Infusions:  potassium chloride 10 mEq (08/11/19 0911)     Radiological Exams on Admission: CT ABDOMEN PELVIS W CONTRAST  Result Date: 08/11/2019 CLINICAL DATA:  Abdominal distension. Bowel obstruction suspected. Patient reportedly has multiple types of cancer. EXAM: CT ABDOMEN AND PELVIS WITH CONTRAST TECHNIQUE: Multidetector CT imaging of the abdomen and pelvis was performed using the standard protocol following bolus administration of intravenous contrast. CONTRAST:  64mL OMNIPAQUE IOHEXOL 300 MG/ML  SOLN COMPARISON:  CT, 07/26/2019. FINDINGS: Lower chest: Right greater than left lower lobe lung base opacity, consistent with atelectasis and similar to the prior exam. No acute findings. Hepatobiliary: No focal liver abnormality is seen. No gallstones, gallbladder wall thickening, or biliary dilatation. Pancreas: Unremarkable. No pancreatic ductal dilatation or surrounding inflammatory changes. Spleen: Normal in size without focal abnormality. Adrenals/Urinary Tract: No adrenal masses. Kidneys normal in  overall size, orientation and position with symmetric enhancement and excretion. Bilateral renal cysts. No stones. No hydronephrosis. Normal ureters. Bladder is unremarkable. Stomach/Bowel: Colon is distended to a maximum of 7.6 cm, right transverse. There are multiple small bubbles of air within the colonic wall reflecting pneumatosis intestinalis, without wall thickening. In the distal sigmoid colon, there is an irregular area of bowel wall thickening and luminal narrowing with the sigmoid colon distal to this being decompressed. In the ascending colon there is a short segment of irregular wall thickening and luminal narrowing. There are scattered colonic diverticula without evidence of diverticulitis. Stomach is unremarkable.  Normal small bowel. There are multiple omental and mesenteric  masses/nodules, largest along the right peritoneal cavity, largest discrete nodule measuring 3.4 cm. The largest nodule on the left measures 2.7 cm. These are similar to the prior CT. Vascular/Lymphatic: Aortic atherosclerosis.  No aneurysm. No enlarged lymph nodes. Reproductive: Multiple radiation therapy seeds along the prostate and prostate bed, stable. Other: Small amount of ascites increased compared to the prior CT. Musculoskeletal: No fracture or acute finding. No osteoblastic or osteolytic lesions. IMPRESSION: 1. Omental and peritoneal metastatic disease, which is similar to the prior CT. 2. Partial distal colonic obstruction due to irregular wall thickening and luminal narrowing of the distal sigmoid colon, which is similar to the prior CT, suspicious for colonic malignancy. There is also a short segment focus of colonic wall thickening and luminal narrowing of the ascending colon, also similar to the prior CT, and suspicious for a second focus of colon carcinoma. 3. Mild right colon pneumatosis intestinalis without wall thickening. No portal venous air. 4. Small amount of ascites that has increased in amount from the  previous CT. Electronically Signed   By: Lajean Manes M.D.   On: 08/11/2019 06:48   DG Chest Port 1 View  Result Date: 08/11/2019 CLINICAL DATA:  Per triage note: Pt has multiple types of Cancer, has been constipated for several days and having severe stomach pain. Tonight the pain worsened and he is now also c/o sob, possibly r/t the pain. Pt states no BM x 5 days EXAM: PORTABLE CHEST 1 VIEW COMPARISON:  04/25/2019 FINDINGS: Cardiac silhouette is normal in size. No mediastinal or hilar masses. Low lung volumes. There is opacity at the lung bases, right greater than left, consistent with atelectasis and similar to the prior exam. Remainder of the lungs is clear. No convincing pleural effusion and no pneumothorax. Skeletal structures are demineralized but grossly intact. IMPRESSION: 1. No acute findings. 2. Lung base opacity, right greater than left, consistent with atelectasis and similar to the prior radiographs. Electronically Signed   By: Lajean Manes M.D.   On: 08/11/2019 06:49    Impression/Recommendations Active Problems:   * No active hospital problems. *  Abdominal pain and distention secondary to advanced GI malignancy likely colorectal -This is a terminal illness with poor prognosis for survival over days to weeks -Patient requests palliative care at home instead of hospitalization -States that he has a plan to stay with his son or daughter at home and will have home hospice evaluation arranged by Tuesday.  I have contacted our CSW to see if earlier arrangements can be made -Discussed case with Dr. Laverta Baltimore that patient does not want to stay in the hospital and I agree that hospitalization is currently not necessary given the fact that there would be no other aggressive medical treatment.  General surgery would not plan to do any intervention for the obstruction. -Recommend sublingual oral morphine as needed to help alleviate pain symptoms through the course of the weekend until further  evaluation by hospice -Continue on other anxiety and pain medications as noted previously   Thank you for this consultation.   Time Spent: 45 minutes  Brave Dack D Sherryann Frese DO [facility] 08/11/2019, 9:49 AM

## 2019-08-11 NOTE — ED Provider Notes (Signed)
Blood pressure 103/62, pulse 86, temperature 98.6 F (37 C), temperature source Oral, resp. rate 19, height 5\' 11"  (1.803 m), weight 64 kg, SpO2 (!) 89 %.  Assuming care from Dr. Betsey Holiday.  In short, Todd Keith is a 79 y.o. male with a chief complaint of Shortness of Breath and Abdominal Pain .  Refer to the original H&P for additional details.  The current plan of care is to follow up general surgery and medicine.  08:15 AM  Spoke with Dr. Constance Haw regarding the case. Risk outweighs benefit in terms of surgical options. Plan for admit for pain control and further goals of care discussion.   Spoke with Dr. Manuella Ghazi who came to consult on the patient.  He offered pain control admission vs home with pain medication.  Prefers to be at home and spend what time he has left out of the hospital.  This seems reasonable.  He is tolerating PO fluids. Dr. Manuella Ghazi will reach out to the social work team to see if his Hospice assistance can be moved up.  Discussed this plan with the patient who is comfortable with the plan at discharge.  Will call in a morphine solution prescription along with Zofran ODT to his pharmacy.     Margette Fast, MD 08/11/19 1003

## 2019-08-11 NOTE — ED Notes (Signed)
Patient has had EKG done and seen by Dr Betsey Holiday. Patient on cardiac monitoring at this time.
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
## 2019-08-11 NOTE — Progress Notes (Addendum)
CSW spoke with intake worker Wells Guiles at [facility] to initiate referral - CSW to make official referral once call is returned by staff member.  CSW attempted to reach patient's daughter Lattie Haw at [phone] without success - no voicemail option available. CSW sent text message requesting a return call.   CSW spoke with patient's granddaughter Urban Gibson at [phone] who states she will come and retrieve the patient shortly. CSW answered all of the granddaughter's questions.   CSW notified Matisha, RN of information.  Madilyn Fireman, MSW, LCSW-A Transitions of Care  Clinical Social Worker  [facility] Emergency Departments  Medical ICU [phone]

## 2019-08-11 NOTE — ED Triage Notes (Signed)
Pt has multiple types of Cancer, has been constipated for several days and having severe stomach pain.  Tonight the pain worsened and he is now also c/o sob, possibly r/t the pain.  Pt states no BM x 5 days

## 2019-08-24 ENCOUNTER — Encounter (HOSPITAL_COMMUNITY): Payer: Self-pay | Admitting: Emergency Medicine

## 2019-08-24 ENCOUNTER — Other Ambulatory Visit: Payer: Self-pay

## 2019-08-24 ENCOUNTER — Inpatient Hospital Stay (HOSPITAL_COMMUNITY)
Admission: EM | Admit: 2019-08-24 | Discharge: 2019-09-06 | DRG: 948 | Disposition: E | Payer: Medicare Other | Attending: Family Medicine | Admitting: Family Medicine

## 2019-08-24 DIAGNOSIS — E860 Dehydration: Secondary | ICD-10-CM | POA: Diagnosis present

## 2019-08-24 DIAGNOSIS — I959 Hypotension, unspecified: Secondary | ICD-10-CM | POA: Diagnosis present

## 2019-08-24 DIAGNOSIS — F419 Anxiety disorder, unspecified: Secondary | ICD-10-CM | POA: Diagnosis present

## 2019-08-24 DIAGNOSIS — E861 Hypovolemia: Secondary | ICD-10-CM | POA: Diagnosis present

## 2019-08-24 DIAGNOSIS — Z79899 Other long term (current) drug therapy: Secondary | ICD-10-CM

## 2019-08-24 DIAGNOSIS — C785 Secondary malignant neoplasm of large intestine and rectum: Secondary | ICD-10-CM | POA: Diagnosis present

## 2019-08-24 DIAGNOSIS — C61 Malignant neoplasm of prostate: Secondary | ICD-10-CM | POA: Diagnosis present

## 2019-08-24 DIAGNOSIS — Z9841 Cataract extraction status, right eye: Secondary | ICD-10-CM

## 2019-08-24 DIAGNOSIS — Z981 Arthrodesis status: Secondary | ICD-10-CM

## 2019-08-24 DIAGNOSIS — Z885 Allergy status to narcotic agent status: Secondary | ICD-10-CM

## 2019-08-24 DIAGNOSIS — C799 Secondary malignant neoplasm of unspecified site: Secondary | ICD-10-CM

## 2019-08-24 DIAGNOSIS — Z66 Do not resuscitate: Secondary | ICD-10-CM | POA: Diagnosis present

## 2019-08-24 DIAGNOSIS — G893 Neoplasm related pain (acute) (chronic): Principal | ICD-10-CM | POA: Diagnosis present

## 2019-08-24 DIAGNOSIS — G629 Polyneuropathy, unspecified: Secondary | ICD-10-CM | POA: Diagnosis present

## 2019-08-24 DIAGNOSIS — Z20822 Contact with and (suspected) exposure to covid-19: Secondary | ICD-10-CM | POA: Diagnosis present

## 2019-08-24 DIAGNOSIS — R109 Unspecified abdominal pain: Secondary | ICD-10-CM

## 2019-08-24 DIAGNOSIS — Z7189 Other specified counseling: Secondary | ICD-10-CM

## 2019-08-24 DIAGNOSIS — Z9842 Cataract extraction status, left eye: Secondary | ICD-10-CM

## 2019-08-24 DIAGNOSIS — Z681 Body mass index (BMI) 19 or less, adult: Secondary | ICD-10-CM

## 2019-08-24 DIAGNOSIS — Z961 Presence of intraocular lens: Secondary | ICD-10-CM | POA: Diagnosis present

## 2019-08-24 DIAGNOSIS — G4733 Obstructive sleep apnea (adult) (pediatric): Secondary | ICD-10-CM | POA: Diagnosis present

## 2019-08-24 DIAGNOSIS — Z515 Encounter for palliative care: Secondary | ICD-10-CM

## 2019-08-24 DIAGNOSIS — F329 Major depressive disorder, single episode, unspecified: Secondary | ICD-10-CM | POA: Diagnosis present

## 2019-08-24 DIAGNOSIS — K219 Gastro-esophageal reflux disease without esophagitis: Secondary | ICD-10-CM | POA: Diagnosis present

## 2019-08-24 DIAGNOSIS — Z7951 Long term (current) use of inhaled steroids: Secondary | ICD-10-CM

## 2019-08-24 DIAGNOSIS — R64 Cachexia: Secondary | ICD-10-CM | POA: Diagnosis present

## 2019-08-24 DIAGNOSIS — F17290 Nicotine dependence, other tobacco product, uncomplicated: Secondary | ICD-10-CM | POA: Diagnosis present

## 2019-08-24 DIAGNOSIS — J449 Chronic obstructive pulmonary disease, unspecified: Secondary | ICD-10-CM | POA: Diagnosis present

## 2019-08-24 DIAGNOSIS — I9589 Other hypotension: Secondary | ICD-10-CM | POA: Diagnosis present

## 2019-08-24 DIAGNOSIS — Z789 Other specified health status: Secondary | ICD-10-CM | POA: Diagnosis present

## 2019-08-24 LAB — CBC WITH DIFFERENTIAL/PLATELET
Abs Immature Granulocytes: 0.06 10*3/uL (ref 0.00–0.07)
Basophils Absolute: 0 10*3/uL (ref 0.0–0.1)
Basophils Relative: 0 %
Eosinophils Absolute: 0 10*3/uL (ref 0.0–0.5)
Eosinophils Relative: 0 %
HCT: 34.5 % — ABNORMAL LOW (ref 39.0–52.0)
Hemoglobin: 10.4 g/dL — ABNORMAL LOW (ref 13.0–17.0)
Immature Granulocytes: 1 %
Lymphocytes Relative: 13 %
Lymphs Abs: 1.3 10*3/uL (ref 0.7–4.0)
MCH: 23.2 pg — ABNORMAL LOW (ref 26.0–34.0)
MCHC: 30.1 g/dL (ref 30.0–36.0)
MCV: 76.8 fL — ABNORMAL LOW (ref 80.0–100.0)
Monocytes Absolute: 0.8 10*3/uL (ref 0.1–1.0)
Monocytes Relative: 8 %
Neutro Abs: 8 10*3/uL — ABNORMAL HIGH (ref 1.7–7.7)
Neutrophils Relative %: 78 %
Platelets: 630 10*3/uL — ABNORMAL HIGH (ref 150–400)
RBC: 4.49 MIL/uL (ref 4.22–5.81)
RDW: 24.3 % — ABNORMAL HIGH (ref 11.5–15.5)
WBC: 10.2 10*3/uL (ref 4.0–10.5)
nRBC: 0 % (ref 0.0–0.2)

## 2019-08-24 LAB — BASIC METABOLIC PANEL
Anion gap: 13 (ref 5–15)
BUN: 24 mg/dL — ABNORMAL HIGH (ref 8–23)
CO2: 23 mmol/L (ref 22–32)
Calcium: 8.1 mg/dL — ABNORMAL LOW (ref 8.9–10.3)
Chloride: 99 mmol/L (ref 98–111)
Creatinine, Ser: 0.91 mg/dL (ref 0.61–1.24)
GFR calc Af Amer: >60 mL/min (ref >60)
GFR calc non Af Amer: >60 mL/min (ref >60)
Glucose, Bld: 114 mg/dL — ABNORMAL HIGH (ref 70–99)
Potassium: 3.3 mmol/L — ABNORMAL LOW (ref 3.5–5.1)
Sodium: 135 mmol/L (ref 135–145)

## 2019-08-24 LAB — SARS CORONAVIRUS 2 BY RT PCR (HOSPITAL ORDER, PERFORMED IN ~~LOC~~ HOSPITAL LAB): SARS Coronavirus 2: NEGATIVE

## 2019-08-24 MED ORDER — HYDROMORPHONE HCL 1 MG/ML IJ SOLN
1.0000 mg | Freq: Once | INTRAMUSCULAR | Status: AC
  Administered 2019-08-24: 1 mg via SUBCUTANEOUS
  Filled 2019-08-24: qty 1

## 2019-08-24 MED ORDER — SODIUM CHLORIDE 0.9 % IV BOLUS
500.0000 mL | Freq: Once | INTRAVENOUS | Status: AC
  Administered 2019-08-24: 500 mL via INTRAVENOUS

## 2019-08-24 MED ORDER — ONDANSETRON 4 MG PO TBDP
4.0000 mg | ORAL_TABLET | Freq: Three times a day (TID) | ORAL | Status: DC | PRN
  Administered 2019-08-25: 4 mg via ORAL
  Filled 2019-08-24: qty 1

## 2019-08-24 MED ORDER — OXYCODONE HCL 5 MG PO TABS
5.0000 mg | ORAL_TABLET | ORAL | Status: DC | PRN
  Administered 2019-08-25 – 2019-08-26 (×6): 5 mg via ORAL
  Filled 2019-08-24 (×6): qty 1

## 2019-08-24 MED ORDER — ALFUZOSIN HCL ER 10 MG PO TB24
10.0000 mg | ORAL_TABLET | Freq: Every day | ORAL | Status: DC
  Administered 2019-08-25 – 2019-08-27 (×3): 10 mg via ORAL
  Filled 2019-08-24 (×5): qty 1

## 2019-08-24 MED ORDER — SODIUM CHLORIDE 0.9 % IV SOLN: INTRAVENOUS | Status: DC

## 2019-08-24 MED ORDER — BUPRENORPHINE 20 MCG/HR TD PTWK: 1.0000 | MEDICATED_PATCH | TRANSDERMAL | Status: DC

## 2019-08-24 MED ORDER — PANTOPRAZOLE SODIUM 40 MG PO TBEC
40.0000 mg | DELAYED_RELEASE_TABLET | Freq: Every day | ORAL | Status: DC
  Administered 2019-08-25 – 2019-08-27 (×3): 40 mg via ORAL
  Filled 2019-08-24 (×3): qty 1

## 2019-08-24 MED ORDER — DULOXETINE HCL 60 MG PO CPEP
60.0000 mg | ORAL_CAPSULE | Freq: Every evening | ORAL | Status: DC
  Administered 2019-08-25 – 2019-08-26 (×2): 60 mg via ORAL
  Filled 2019-08-24: qty 1
  Filled 2019-08-24: qty 2

## 2019-08-24 MED ORDER — CLONAZEPAM 0.5 MG PO TABS
1.0000 mg | ORAL_TABLET | Freq: Two times a day (BID) | ORAL | Status: DC | PRN
  Administered 2019-08-25 (×2): 1 mg via ORAL
  Filled 2019-08-24 (×4): qty 2

## 2019-08-24 MED ORDER — ZOLPIDEM TARTRATE 5 MG PO TABS: 5.0000 mg | ORAL_TABLET | Freq: Every evening | ORAL | Status: DC | PRN

## 2019-08-24 MED ORDER — MOMETASONE FURO-FORMOTEROL FUM 200-5 MCG/ACT IN AERO
2.0000 | INHALATION_SPRAY | Freq: Two times a day (BID) | RESPIRATORY_TRACT | Status: DC
  Administered 2019-08-25 – 2019-08-28 (×7): 2 via RESPIRATORY_TRACT
  Filled 2019-08-24: qty 8.8

## 2019-08-24 MED ORDER — ALBUTEROL SULFATE HFA 108 (90 BASE) MCG/ACT IN AERS
2.0000 | INHALATION_SPRAY | RESPIRATORY_TRACT | Status: DC | PRN
  Administered 2019-08-25: 2 via RESPIRATORY_TRACT
  Filled 2019-08-24: qty 6.7
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
## 2019-08-24 NOTE — ED Triage Notes (Signed)
[facility] - pt c/o abd pain from "his cancer" that is worse than normal.
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
## 2019-08-25 ENCOUNTER — Other Ambulatory Visit: Payer: Self-pay

## 2019-08-25 DIAGNOSIS — I959 Hypotension, unspecified: Secondary | ICD-10-CM | POA: Diagnosis present

## 2019-08-25 MED ORDER — SODIUM CHLORIDE 0.9 % IV BOLUS
1000.0000 mL | Freq: Once | INTRAVENOUS | Status: AC
  Administered 2019-08-25: 1000 mL via INTRAVENOUS

## 2019-08-25 MED ORDER — ONDANSETRON HCL 4 MG PO TABS: 4.0000 mg | ORAL_TABLET | Freq: Four times a day (QID) | ORAL | Status: DC | PRN

## 2019-08-25 MED ORDER — HYDROMORPHONE HCL 1 MG/ML IJ SOLN
1.0000 mg | INTRAMUSCULAR | Status: DC | PRN
  Administered 2019-08-26 (×4): 1 mg via INTRAVENOUS
  Filled 2019-08-25 (×4): qty 1

## 2019-08-25 MED ORDER — HYDROMORPHONE HCL 1 MG/ML IJ SOLN
1.0000 mg | Freq: Once | INTRAMUSCULAR | Status: AC
  Administered 2019-08-25: 1 mg via INTRAVENOUS
  Filled 2019-08-25: qty 1

## 2019-08-25 MED ORDER — SODIUM CHLORIDE 0.9 % IV SOLN: INTRAVENOUS | Status: DC

## 2019-08-25 MED ORDER — ALBUTEROL SULFATE (2.5 MG/3ML) 0.083% IN NEBU: 2.5000 mg | INHALATION_SOLUTION | RESPIRATORY_TRACT | Status: DC | PRN

## 2019-08-25 MED ORDER — ACETAMINOPHEN 325 MG PO TABS: 650.0000 mg | ORAL_TABLET | Freq: Four times a day (QID) | ORAL | Status: DC | PRN

## 2019-08-25 MED ORDER — ONDANSETRON HCL 4 MG/2ML IJ SOLN
4.0000 mg | Freq: Four times a day (QID) | INTRAMUSCULAR | Status: DC | PRN
  Administered 2019-08-26 – 2019-08-28 (×2): 4 mg via INTRAVENOUS
  Filled 2019-08-25 (×2): qty 2

## 2019-08-25 MED ORDER — HEPARIN SODIUM (PORCINE) 5000 UNIT/ML IJ SOLN
5000.0000 [IU] | Freq: Three times a day (TID) | INTRAMUSCULAR | Status: DC
  Administered 2019-08-25 – 2019-08-28 (×7): 5000 [IU] via SUBCUTANEOUS
  Filled 2019-08-25 (×7): qty 1

## 2019-08-25 MED ORDER — ACETAMINOPHEN 650 MG RE SUPP: 650.0000 mg | Freq: Four times a day (QID) | RECTAL | Status: DC | PRN

## 2019-08-25 NOTE — Care Management (Signed)
CM spoke with Vaughan Basta at [facility]. She requested additional pt info which has been faxed. Hospise does not have a bed today and can not place pt on the waiting list today. Someone from hospice will have to come assess Monday to determine eligibility. CM has reached out to [place] with APS. Report will be assigned to case worker and assessed on Monday.

## 2019-08-25 NOTE — ED Notes (Signed)
lISA [phone]

## 2019-08-25 NOTE — ED Provider Notes (Signed)
It is my medical recommendation at this patient is a candidate for inpatient hospice services.  He has had this discussions with his oncologist Dr. Alen Blew as noted on 08/09/19 office note, and was felt to be a candidate for hospice at that time, and had expressed desire to do so.    It appears he has had a very acute decline in function and status over the past several weeks, and if trajectory continues, I believe he may likely die from this condition within days to weeks.   From Dr Hazeline Junker oncology note on 08/09/19:  " Alternative option would be to proceed with hospice which is his preference at this time.  He has been offered hospice during his recent hospitalization and he is agreeable at this time.  He understands he has an incurable malignancy and disease and will likely will take over his body rather quickly. Chemotherapy my offer temporary palliation but he has declined at this time. I will make the appropriate referral to hospice at this time where he can be followed up locally."  Furthermore from a hospitalist note by Dr Manuella Ghazi on 08/11/19:  "He apparently is being set up for hospice at home care on Tuesday of next week as he has been told that he does not have very long to survive by his oncologist.  He has recently declined any palliative chemotherapy and understands that he has an incurable malignancy."         Wyvonnia Dusky, MD 08/25/19 [phone]

## 2019-08-25 NOTE — ED Provider Notes (Signed)
Patient with metastatic pancreatic cancer.  Patient is complaining of abdominal discomfort from the cancer.  He is also hypotension from dehydration and not taking enough fluids in.  He will be admitted to medicine for the dehydration abdominal pain and they will continue to work on getting the patient a hospice bed   Milton Ferguson, MD 08/25/19 2037

## 2019-08-25 NOTE — ED Notes (Signed)
Patient pulled off all wires and bp cuff. Patient had pulled off oxygen also. Items replaced and patient repositioned.

## 2019-08-25 NOTE — ED Notes (Signed)
Pt resting with equal rise and chest fall  

## 2019-08-26 DIAGNOSIS — J449 Chronic obstructive pulmonary disease, unspecified: Secondary | ICD-10-CM | POA: Diagnosis present

## 2019-08-26 DIAGNOSIS — Z7951 Long term (current) use of inhaled steroids: Secondary | ICD-10-CM | POA: Diagnosis not present

## 2019-08-26 DIAGNOSIS — I9589 Other hypotension: Secondary | ICD-10-CM | POA: Diagnosis present

## 2019-08-26 DIAGNOSIS — Z9841 Cataract extraction status, right eye: Secondary | ICD-10-CM | POA: Diagnosis not present

## 2019-08-26 DIAGNOSIS — Z681 Body mass index (BMI) 19 or less, adult: Secondary | ICD-10-CM | POA: Diagnosis not present

## 2019-08-26 DIAGNOSIS — E861 Hypovolemia: Secondary | ICD-10-CM

## 2019-08-26 DIAGNOSIS — Z981 Arthrodesis status: Secondary | ICD-10-CM | POA: Diagnosis not present

## 2019-08-26 DIAGNOSIS — I959 Hypotension, unspecified: Secondary | ICD-10-CM | POA: Diagnosis present

## 2019-08-26 DIAGNOSIS — C61 Malignant neoplasm of prostate: Secondary | ICD-10-CM | POA: Diagnosis present

## 2019-08-26 DIAGNOSIS — C799 Secondary malignant neoplasm of unspecified site: Secondary | ICD-10-CM | POA: Diagnosis present

## 2019-08-26 DIAGNOSIS — K219 Gastro-esophageal reflux disease without esophagitis: Secondary | ICD-10-CM | POA: Diagnosis present

## 2019-08-26 DIAGNOSIS — Z885 Allergy status to narcotic agent status: Secondary | ICD-10-CM | POA: Diagnosis not present

## 2019-08-26 DIAGNOSIS — Z20822 Contact with and (suspected) exposure to covid-19: Secondary | ICD-10-CM | POA: Diagnosis present

## 2019-08-26 DIAGNOSIS — C189 Malignant neoplasm of colon, unspecified: Secondary | ICD-10-CM | POA: Diagnosis present

## 2019-08-26 DIAGNOSIS — Z961 Presence of intraocular lens: Secondary | ICD-10-CM | POA: Diagnosis present

## 2019-08-26 DIAGNOSIS — G629 Polyneuropathy, unspecified: Secondary | ICD-10-CM | POA: Diagnosis present

## 2019-08-26 DIAGNOSIS — Z66 Do not resuscitate: Secondary | ICD-10-CM | POA: Diagnosis present

## 2019-08-26 DIAGNOSIS — Z803 Family history of malignant neoplasm of breast: Secondary | ICD-10-CM | POA: Diagnosis not present

## 2019-08-26 DIAGNOSIS — Z9842 Cataract extraction status, left eye: Secondary | ICD-10-CM | POA: Diagnosis not present

## 2019-08-26 DIAGNOSIS — Z923 Personal history of irradiation: Secondary | ICD-10-CM | POA: Diagnosis not present

## 2019-08-26 DIAGNOSIS — R109 Unspecified abdominal pain: Secondary | ICD-10-CM | POA: Diagnosis present

## 2019-08-26 DIAGNOSIS — F17290 Nicotine dependence, other tobacco product, uncomplicated: Secondary | ICD-10-CM | POA: Diagnosis present

## 2019-08-26 DIAGNOSIS — F329 Major depressive disorder, single episode, unspecified: Secondary | ICD-10-CM | POA: Diagnosis present

## 2019-08-26 DIAGNOSIS — Z789 Other specified health status: Secondary | ICD-10-CM | POA: Diagnosis present

## 2019-08-26 DIAGNOSIS — E860 Dehydration: Secondary | ICD-10-CM | POA: Diagnosis present

## 2019-08-26 DIAGNOSIS — R1084 Generalized abdominal pain: Secondary | ICD-10-CM | POA: Diagnosis not present

## 2019-08-26 DIAGNOSIS — Z7189 Other specified counseling: Secondary | ICD-10-CM | POA: Diagnosis not present

## 2019-08-26 DIAGNOSIS — C785 Secondary malignant neoplasm of large intestine and rectum: Secondary | ICD-10-CM | POA: Diagnosis present

## 2019-08-26 DIAGNOSIS — G8929 Other chronic pain: Secondary | ICD-10-CM | POA: Diagnosis present

## 2019-08-26 DIAGNOSIS — G4733 Obstructive sleep apnea (adult) (pediatric): Secondary | ICD-10-CM | POA: Diagnosis present

## 2019-08-26 DIAGNOSIS — G893 Neoplasm related pain (acute) (chronic): Secondary | ICD-10-CM | POA: Diagnosis present

## 2019-08-26 DIAGNOSIS — K5669 Other partial intestinal obstruction: Secondary | ICD-10-CM | POA: Diagnosis not present

## 2019-08-26 DIAGNOSIS — F419 Anxiety disorder, unspecified: Secondary | ICD-10-CM | POA: Diagnosis present

## 2019-08-26 DIAGNOSIS — Z79899 Other long term (current) drug therapy: Secondary | ICD-10-CM | POA: Diagnosis not present

## 2019-08-26 DIAGNOSIS — Z515 Encounter for palliative care: Secondary | ICD-10-CM | POA: Diagnosis present

## 2019-08-26 LAB — GLUCOSE, CAPILLARY
Glucose-Capillary: 104 mg/dL — ABNORMAL HIGH (ref 70–99)
Glucose-Capillary: 127 mg/dL — ABNORMAL HIGH (ref 70–99)
Glucose-Capillary: 142 mg/dL — ABNORMAL HIGH (ref 70–99)

## 2019-08-26 MED ORDER — HEPARIN SODIUM (PORCINE) 5000 UNIT/ML IJ SOLN: 5000.0000 [IU] | Freq: Three times a day (TID) | INTRAMUSCULAR | Status: DC

## 2019-08-26 MED ORDER — HYDROMORPHONE HCL 1 MG/ML IJ SOLN
1.0000 mg | INTRAMUSCULAR | Status: DC | PRN
  Administered 2019-08-26 – 2019-08-28 (×10): 1 mg via INTRAVENOUS
  Filled 2019-08-26 (×10): qty 1

## 2019-08-26 MED ORDER — LORAZEPAM 2 MG/ML IJ SOLN
1.0000 mg | Freq: Four times a day (QID) | INTRAMUSCULAR | Status: DC | PRN
  Administered 2019-08-27 – 2019-08-28 (×4): 1 mg via INTRAVENOUS
  Filled 2019-08-26 (×4): qty 1

## 2019-08-26 MED ORDER — BISACODYL 10 MG RE SUPP
10.0000 mg | Freq: Once | RECTAL | Status: AC
  Administered 2019-08-26: 10 mg via RECTAL
  Filled 2019-08-26: qty 1

## 2019-08-26 NOTE — Plan of Care (Signed)
See patient's updated orders. Family updated by phone today. Patient remains in pain, his abdomen is taut and distended. MD in with patient several times during this shift. Patient comfortable with increased pain medications and pain management now.   Problem: Education: Goal: Knowledge of General Education information will improve Description: Including pain rating scale, medication(s)/side effects and non-pharmacologic comfort measures Outcome: Progressing

## 2019-08-26 NOTE — Progress Notes (Addendum)
  Patient seen and evaluated, chart reviewed, please see EMR for updated orders. Please see full H&P dictated by admitting physician Dr. Humphrey Rolls for same date of service.    Brief Summary:- 79 y.o. male with medical history significant of metastatic prostate cancer is brought to ED for severe generalized weakness and abdominal pain-admitted on 08/26/2019 for pain control and IV hydration  A/p 1)Stage IV Metastatic Prostate Cancer--significant pain, will change IV Dilaudid to 1 mg every 12 hours -Awaiting further palliative care and hospice input, anticipate transfer to hospice house  2) hypotension due to hypovolemia and dehydration--continue IV fluids given very poor oral intake -  3) social/ethics--- DNR/DNI, patient already declined aggressive treatment modalities, declined further oncology input -Awaiting for transition to hospice and comfort care after further conversations with palliative care and hospice team in a.m.   Patient seen and evaluated, chart reviewed, please see EMR for updated orders. Please see full H&P dictated by admitting physician Dr. Humphrey Rolls for same date of service.   Roxan Hockey, MD

## 2019-08-26 NOTE — H&P (Signed)
History and Physical    Todd Keith JIF:[id number] DOB: 1940/11/20 DOA: 08/21/2019  PCP: Ponciano Ort, [facility] (Confirm with patient/family/NH records and if not entered, this has to be entered at [facility] point of entry) Patient coming from: Home  I have personally briefly reviewed patient's old medical records in [facility]  Chief Complaint: Abdominal pain  HPI: Todd Keith is a 79 y.o. male with medical history significant of metastatic prostate cancer is brought to ED for severe generalized weakness and abdominal pain.  Patient cannot take care of himself and the family members are also not willing to help him.  Patient states that his health condition is declining very quickly day by day and he also declined chemotherapy because he understands that he is having incurable disease and will die soon.  Patient admits of abdominal pain, generalized weakness and dyspnea with exertion but otherwise denies fever, chills, chest pain and urinary symptoms.    ED Course: Patient is angling in the ED for more than 24 hours and transition care team also contacted and they are working on the placement in hospice care.  While in the ED patient became severely hypotensive with worsening abdominal pain and started on IV fluids and pain medications.  Review of Systems: As per HPI otherwise 10 point review of systems negative.  Unacceptable ROS statements: "10 systems reviewed," "Extensive" (without elaboration).  Acceptable ROS statements: "All others negative," "All others reviewed and are negative," and "All others unremarkable," with at LEAST ONE ROS documented Can't double dip - if using for HPI can't use for ROS  Past Medical History:  Diagnosis Date  . Anxiety   . Arthritis    hips and back  . COPD (chronic obstructive pulmonary disease) (HCC)   . Depression   . Dyspnea on exertion   . Floater, vitreous, left    post op cataract surgery 2017  . GERD (gastroesophageal reflux  disease)   . Headache(784.0)   . History of retinal hemorrhage    post op cataract surgery left eye 2017 approx.,  resolved  . Mild sleep apnea    per pt had sleep study approx. was told had mild osa and no cpap recommended  . Neuropathy, peripheral    feet and up legs  . 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 Nocturia   . Prostate cancer [facility]) urologist-  dr eskridge/  oncologist-  dr Kathrynn Running   dx 11-09-2016  Stage T2a, Gleason 3+4, PSA 5.91, vol 32.93g-- scheduled for radioactive seed implants  . Wears glasses     Past Surgical History:  Procedure Laterality Date  . ANTERIOR CERVICAL DECOMP/DISCECTOMY FUSION N/A 06/05/2012   Procedure: Cervical six - seven  ANTERIOR CERVICAL DECOMPRESSION/DISCECTOMY FUSION 1 LEVEL REMOVAL OF HARDWARE Cervical four - six;  Surgeon: Temple Pacini, MD;  Location: [facility];  Service: Neurosurgery;  Laterality: N/A;  . CATARACT EXTRACTION W/ INTRAOCULAR LENS IMPLANT Left 2017  approx.  Marland Kitchen CATARACT EXTRACTION W/PHACO Right 05/15/2018   Procedure: CATARACT EXTRACTION PHACO AND INTRAOCULAR LENS PLACEMENT (IOC);  Surgeon: Fabio Pierce, MD;  Location: [facility];  Service: Ophthalmology;  Laterality: Right;  CDE: 25.69  . CERVICAL DISC SURGERY  1990s and 2004  approx.   C4-6  . PROSTATE BIOPSY  11-09-2016   dr eskridge office  . RADIOACTIVE SEED IMPLANT N/A 07/05/2017   Procedure: RADIOACTIVE SEED IMPLANT/BRACHYTHERAPY IMPLANT;  Surgeon: Jerilee Field, MD;  Location: [facility];  Service: Urology;  Laterality: N/A;  ONLY  NEEDS 120 MIN FOR BOTH PROCEDURES  . SPACE OAR INSTILLATION N/A 07/05/2017   Procedure: SPACE OAR INSTILLATION;  Surgeon: Festus Aloe, MD;  Location: [facility];  Service: Urology;  Laterality: N/A;     reports that he has been smoking cigars. He has smoked for the past 60.00 years. He has never used smokeless tobacco. He reports that he does not drink alcohol and does not use drugs.  Allergies  Allergen Reactions  .  Oxycodone Itching    Family History  Problem Relation Age of Onset  . Cancer Mother        breast  . Cancer Father        unknown    Unacceptable: Noncontributory, unremarkable, or negative. Acceptable: (example)Family history negative for heart disease  Prior to Admission medications   Medication Sig Start Date End Date Taking? Authorizing Provider  albuterol (PROAIR HFA) 108 (90 BASE) MCG/ACT inhaler Inhale 2 puffs into the lungs every 4 (four) hours as needed.    Yes [provider]  alfuzosin (UROXATRAL) 10 MG 24 hr tablet Take 10 mg by mouth daily. 08/07/19  Yes [provider]  buprenorphine (BUTRANS) 20 MCG/HR PTWK Place 1 patch onto the skin once a week. 07/21/19  Yes [provider]  clonazePAM (KLONOPIN) 1 MG tablet Take 1 mg by mouth 2 (two) times daily as needed for anxiety.  07/20/17  Yes [provider]  DULoxetine (CYMBALTA) 60 MG capsule Take 60 mg by mouth every evening.    Yes [provider]  eszopiclone (LUNESTA) 2 MG TABS tablet Take 2 mg by mouth at bedtime. Take immediately before bedtime   Yes [provider]  Fluticasone-Salmeterol (ADVAIR DISKUS) 250-50 MCG/DOSE AEPB Inhale 1 puff into the lungs 2 (two) times daily.  10/08/14  Yes [provider]  furosemide (LASIX) 20 MG tablet Take 20 mg by mouth daily. 07/10/19  Yes [provider]  levocetirizine (XYZAL) 5 MG tablet Take 5 mg by mouth daily.  07/16/17  Yes [provider]  loratadine (CLARITIN) 10 MG tablet Take 5 mg by mouth every evening.    Yes [provider]  NARCAN 4 MG/0.1ML LIQD nasal spray kit  07/20/17  Yes [provider]  ondansetron (ZOFRAN ODT) 4 MG disintegrating tablet Take 1 tablet (4 mg total) by mouth every 8 (eight) hours as needed. 08/11/19  Yes Long, Wonda Olds, MD  oxyCODONE (OXY IR/ROXICODONE) 5 MG immediate release tablet Take 5 mg by mouth every 4 (four) hours as needed for pain. 08/22/19  Yes  [provider]  pantoprazole (PROTONIX) 40 MG tablet Take 40 mg by mouth daily. 03/13/19  Yes [provider]  [place] 2.5 MCG/ACT AERS  07/19/17   [provider]    Physical Exam: Vitals:   08/25/19 2316 08/26/19 0214 08/26/19 0518 08/26/19 0519  BP:  103/66 105/67   Pulse:  (!) 108 97 98  Resp:  19 15   Temp:  98.6 F (37 C) 99.1 F (37.3 C)   TempSrc:  Oral Oral   SpO2:  90% (!) 79% 95%  Weight: 45.8 kg     Height: '5\' 11"'$  (1.803 m)       Constitutional: NAD, calm, comfortable Vitals:   08/25/19 2316 08/26/19 0214 08/26/19 0518 08/26/19 0519  BP:  103/66 105/67   Pulse:  (!) 108 97 98  Resp:  19 15   Temp:  98.6 F (37 C) 99.1 F (37.3 C)  TempSrc:  Oral Oral   SpO2:  90% (!) 79% 95%  Weight: 45.8 kg     Height: '5\' 11"'$  (1.803 m)       General: Patient is a cachectic Caucasian male who looks very weak and tired but not in acute distress. Eyes: PERRL, lids and conjunctivae normal ENMT: Mucous membranes are moist. Posterior pharynx clear of any exudate or lesions.Normal dentition.  Neck: normal, supple, no masses, no thyromegaly Respiratory: clear to auscultation bilaterally, no wheezing, no crackles. Normal respiratory effort. No accessory muscle use.  Cardiovascular: Regular rate and rhythm, no murmurs / rubs / gallops. No extremity edema. 2+ pedal pulses. No carotid bruits.  Abdomen: Abdomen is soft mildly distended and tenderness palpation.  Abdominal metastatic masses palpated. Bowel sounds positive.  Musculoskeletal: no clubbing / cyanosis. No joint deformity upper and lower extremities. Good ROM, no contractures. Normal muscle tone.  Skin: no rashes, lesions, ulcers. No induration Neurologic: CN 2-12 grossly intact. Sensation intact, DTR normal. Strength 5/5 in all 4.  Psychiatric: Normal judgment and insight. Alert and oriented x 3. Normal mood.   (Anything < 9 systems with 2 bullets each down codes to level 1) (If patient  refuses exam can't bill higher level) (Make sure to document decubitus ulcers present on admission -- if possible -- and whether patient has chronic indwelling catheter at time of admission)  Labs on Admission: I have personally reviewed following labs and imaging studies  CBC: Recent Labs  Lab 08/11/2019 2152  WBC 10.2  NEUTROABS 8.0*  HGB 10.4*  HCT 34.5*  MCV 76.8*  PLT 798*   Basic Metabolic Panel: Recent Labs  Lab 08/14/2019 2152  NA 135  K 3.3*  CL 99  CO2 23  GLUCOSE 114*  BUN 24*  CREATININE 0.91  CALCIUM 8.1*   GFR: Estimated Creatinine Clearance: 41.2 mL/min (by C-G formula based on SCr of 0.91 mg/dL). Liver Function Tests: No results for input(s): AST, ALT, ALKPHOS, BILITOT, PROT, ALBUMIN in the last 168 hours. No results for input(s): LIPASE, AMYLASE in the last 168 hours. No results for input(s): AMMONIA in the last 168 hours. Coagulation Profile: No results for input(s): INR, PROTIME in the last 168 hours. Cardiac Enzymes: No results for input(s): CKTOTAL, CKMB, CKMBINDEX, TROPONINI in the last 168 hours. BNP (last 3 results) No results for input(s): PROBNP in the last 8760 hours. HbA1C: No results for input(s): HGBA1C in the last 72 hours. CBG: No results for input(s): GLUCAP in the last 168 hours. Lipid Profile: No results for input(s): CHOL, HDL, LDLCALC, TRIG, CHOLHDL, LDLDIRECT in the last 72 hours. Thyroid Function Tests: No results for input(s): TSH, T4TOTAL, FREET4, T3FREE, THYROIDAB in the last 72 hours. Anemia Panel: No results for input(s): VITAMINB12, FOLATE, FERRITIN, TIBC, IRON, RETICCTPCT in the last 72 hours. Urine analysis: No results found for: COLORURINE, APPEARANCEUR, LABSPEC, PHURINE, GLUCOSEU, HGBUR, BILIRUBINUR, KETONESUR, PROTEINUR, UROBILINOGEN, NITRITE, LEUKOCYTESUR  Radiological Exams on Admission: No results found.    Assessment/Plan Principal Problem:   Hypotension, unspecified Patient is given a bolus of 1 L normal  saline in the ED and will continue IV fluids at a rate of 125 mL/h.  Hypotension is most likely secondary to poor oral intake in the setting of metastatic cancer.  Continue to monitor  Active Problems:   Malignant neoplasm of prostate [facility]) Patient denied chemotherapy according to the oncology note and medical records. Continue pain medications as needed for pain management    Abdominal pain Continue with pain medications  Self-care deficit Transition care team consulted and they are working on the placement in hospice care center.  (please populate well all problems here in Problem List. (For example, if patient is on BP meds at home and you resume or decide to hold them, it is a problem that needs to be her. Same for CAD, COPD, HLD and so on)     DVT prophylaxis: Heparin Code Status: DNR Family Communication: No family present at the bedside Disposition Plan: Hospice care Consults called: Transition care team Admission status: telemetry/observation   Edmonia Lynch MD [facility] Pager [phone]   If 7PM-7AM, please contact night-coverage [email or web address] Password [email or web address]  08/26/2019, 7:30 AM

## 2019-08-27 ENCOUNTER — Encounter (HOSPITAL_COMMUNITY): Payer: Self-pay | Admitting: Family Medicine

## 2019-08-27 DIAGNOSIS — Z7189 Other specified counseling: Secondary | ICD-10-CM

## 2019-08-27 DIAGNOSIS — Z515 Encounter for palliative care: Secondary | ICD-10-CM

## 2019-08-27 DIAGNOSIS — R1084 Generalized abdominal pain: Secondary | ICD-10-CM

## 2019-08-27 NOTE — Progress Notes (Signed)
Patient Demographics:    Todd Keith, is a 79 y.o. male, DOB - April 28, 1940, XMI:[id number]  Admit date - 08/17/2019   Admitting Physician Everline Mahaffy Denton Brick, MD  Outpatient Primary MD for the patient is [facility]  LOS - 1   Chief Complaint  Patient presents with  . Abdominal Pain        Subjective:    Su Hilt today has no fevers, no emesis,  No chest pain,   --Pain control improving with adjustments to patient's medications  Assessment  & Plan :    Principal Problem:   Hypotension, unspecified Active Problems:   Malignant neoplasm of prostate (HCC)   Abdominal pain   Self-care deficit   Prostate cancer metastatic to multiple sites [facility])   Goals of care, counseling/discussion   Palliative care by specialist   Encounter for hospice care discussion   Brief Summary:- 79 y.o.malewith medical history significant ofmetastatic prostate cancer, COPD, depression, GERD, chronic pain with prior anterior cervical discectomy fusion in 2014 is brought to ED for severe generalized weakness and abdominal pain and found to have hypotension-admitted on 08/26/2019 for pain control and IV hydration  A/p 1)Stage IV Metastatic Prostate Cancer--significant pain,  IV Dilaudid adjusted- --palliative care input appreciated  --Awaiting official hospice consult and possible transfer to residential hospice    2) hypotension due to hypovolemia and dehydration-improved with IV fluids given very poor oral intake - 3) social/ethics--- DNR/DNI, patient already declined aggressive treatment modalities, declined further oncology input -Awaiting for transition to hospice and comfort care after further conversations with hospice team   Disposition/Need for in-Hospital Stay- patient unable to be discharged at this time due to --awaiting transition to residential hospice  Status is:  Inpatient  Remains inpatient appropriate because:Unsafe discharge plan, awaiting transfer to residential hospice   Disposition: The patient is from: Home              Anticipated d/c is to: Residential hospice              Anticipated d/c date is: 1 day              Patient currently is medically stable to d/c. Barriers: Awaiting transfer to residential hospice Code Status : DNR   Family Communication: Discussed with daughter Lattie Haw Consults  :  Palliative/Hospice  DVT Prophylaxis  :  - SCDs  (comfort care)  Lab Results  Component Value Date   PLT 630 (H) 08/29/2019    Inpatient Medications  Scheduled Meds: . alfuzosin  10 mg Oral Daily  . DULoxetine  60 mg Oral QPM  . heparin  5,000 Units Subcutaneous Q8H  . mometasone-formoterol  2 puff Inhalation BID  . pantoprazole  40 mg Oral Daily   Continuous Infusions: . sodium chloride 50 mL/hr at 08/27/19 1609   PRN Meds:.acetaminophen **OR** acetaminophen, albuterol, HYDROmorphone (DILAUDID) injection, LORazepam, ondansetron **OR** ondansetron (ZOFRAN) IV, ondansetron, oxyCODONE, zolpidem   Anti-infectives (From admission, onward)   None        Objective:   Vitals:   08/26/19 2355 08/27/19 0503 08/27/19 0732 08/27/19 1341  BP:  100/63  91/60  Pulse:  (!) 109  (!) 109  Resp:  17  18  Temp:  99.2 F (37.3 C)  98.1 F (36.7 C)  TempSrc:  Oral  Axillary  SpO2: 96% 91% (!) 83%   Weight:      Height:        Wt Readings from Last 3 Encounters:  08/25/19 45.8 kg  08/11/19 64 kg  08/09/19 64.1 kg     Intake/Output Summary (Last 24 hours) at 08/27/2019 1735 Last data filed at 08/27/2019 1342 Gross per 24 hour  Intake 1344.12 ml  Output --  Net 1344.12 ml     Physical Exam  Gen:- Awake Alert, cachectic appearing, more comfortable,  HEENT:- [place].AT, No sclera icterus Neck-Supple Neck,No JVD,.  Lungs-  CTAB , fair symmetrical air movement CV- S1, S2 normal, regular  Abd-  +ve B.Sounds, Abd Soft, No tenderness,  less distended Extremity/Skin:- No  edema, pedal pulses present  Psych-affect is appropriate, oriented x3 Neuro-generalized weakness no new focal deficits, no tremors   Data Review:   Micro Results Recent Results (from the past 240 hour(s))  SARS Coronavirus 2 by RT PCR (hospital order, performed in [facility] hospital lab) Nasopharyngeal Nasopharyngeal Swab     Status: None   Collection Time: 08/26/2019  9:33 PM   Specimen: Nasopharyngeal Swab  Result Value Ref Range Status   SARS Coronavirus 2 NEGATIVE NEGATIVE Final    Comment: (NOTE) SARS-CoV-2 target nucleic acids are NOT DETECTED.  The SARS-CoV-2 RNA is generally detectable in upper and lower respiratory specimens during the acute phase of infection. The lowest concentration of SARS-CoV-2 viral copies this assay can detect is 250 copies / mL. A negative result does not preclude SARS-CoV-2 infection and should not be used as the sole basis for treatment or other patient management decisions.  A negative result may occur with improper specimen collection / handling, submission of specimen other than nasopharyngeal swab, presence of viral mutation(s) within the areas targeted by this assay, and inadequate number of viral copies (<250 copies / mL). A negative result must be combined with clinical observations, patient history, and epidemiological information.  Fact Sheet for Patients:   StrictlyIdeas.no  Fact Sheet for Healthcare Providers: BankingDealers.co.za  This test is not yet approved or  cleared by the [place] FDA and has been authorized for detection and/or diagnosis of SARS-CoV-2 by FDA under an Emergency Use Authorization (EUA).  This EUA will remain in effect (meaning this test can be used) for the duration of the COVID-19 declaration under Section 564(b)(1) of the Act, 21 U.S.C. section 360bbb-3(b)(1), unless the authorization is terminated or revoked  sooner.  Performed at [facility], [address], [place], [place] [address]     Radiology Reports CT ABDOMEN PELVIS W CONTRAST  Result Date: 08/11/2019 CLINICAL DATA:  Abdominal distension. Bowel obstruction suspected. Patient reportedly has multiple types of cancer. EXAM: CT ABDOMEN AND PELVIS WITH CONTRAST TECHNIQUE: Multidetector CT imaging of the abdomen and pelvis was performed using the standard protocol following bolus administration of intravenous contrast. CONTRAST:  41mL OMNIPAQUE IOHEXOL 300 MG/ML  SOLN COMPARISON:  CT, 07/26/2019. FINDINGS: Lower chest: Right greater than left lower lobe lung base opacity, consistent with atelectasis and similar to the prior exam. No acute findings. Hepatobiliary: No focal liver abnormality is seen. No gallstones, gallbladder wall thickening, or biliary dilatation. Pancreas: Unremarkable. No pancreatic ductal dilatation or surrounding inflammatory changes. Spleen: Normal in size without focal abnormality. Adrenals/Urinary Tract: No adrenal masses. Kidneys normal in overall size, orientation and position with symmetric enhancement and excretion. Bilateral renal cysts. No stones. No hydronephrosis. Normal ureters. Bladder is unremarkable.  Stomach/Bowel: Colon is distended to a maximum of 7.6 cm, right transverse. There are multiple small bubbles of air within the colonic wall reflecting pneumatosis intestinalis, without wall thickening. In the distal sigmoid colon, there is an irregular area of bowel wall thickening and luminal narrowing with the sigmoid colon distal to this being decompressed. In the ascending colon there is a short segment of irregular wall thickening and luminal narrowing. There are scattered colonic diverticula without evidence of diverticulitis. Stomach is unremarkable.  Normal small bowel. There are multiple omental and mesenteric masses/nodules, largest along the right peritoneal cavity, largest discrete nodule measuring 3.4 cm. The  largest nodule on the left measures 2.7 cm. These are similar to the prior CT. Vascular/Lymphatic: Aortic atherosclerosis.  No aneurysm. No enlarged lymph nodes. Reproductive: Multiple radiation therapy seeds along the prostate and prostate bed, stable. Other: Small amount of ascites increased compared to the prior CT. Musculoskeletal: No fracture or acute finding. No osteoblastic or osteolytic lesions. IMPRESSION: 1. Omental and peritoneal metastatic disease, which is similar to the prior CT. 2. Partial distal colonic obstruction due to irregular wall thickening and luminal narrowing of the distal sigmoid colon, which is similar to the prior CT, suspicious for colonic malignancy. There is also a short segment focus of colonic wall thickening and luminal narrowing of the ascending colon, also similar to the prior CT, and suspicious for a second focus of colon carcinoma. 3. Mild right colon pneumatosis intestinalis without wall thickening. No portal venous air. 4. Small amount of ascites that has increased in amount from the previous CT. Electronically Signed   By: Lajean Manes M.D.   On: 08/11/2019 06:48   DG Chest Port 1 View  Result Date: 08/11/2019 CLINICAL DATA:  Per triage note: Pt has multiple types of Cancer, has been constipated for several days and having severe stomach pain. Tonight the pain worsened and he is now also c/o sob, possibly r/t the pain. Pt states no BM x 5 days EXAM: PORTABLE CHEST 1 VIEW COMPARISON:  04/25/2019 FINDINGS: Cardiac silhouette is normal in size. No mediastinal or hilar masses. Low lung volumes. There is opacity at the lung bases, right greater than left, consistent with atelectasis and similar to the prior exam. Remainder of the lungs is clear. No convincing pleural effusion and no pneumothorax. Skeletal structures are demineralized but grossly intact. IMPRESSION: 1. No acute findings. 2. Lung base opacity, right greater than left, consistent with atelectasis and similar to  the prior radiographs. Electronically Signed   By: Lajean Manes M.D.   On: 08/11/2019 06:49     CBC Recent Labs  Lab 08/20/2019 2152  WBC 10.2  HGB 10.4*  HCT 34.5*  PLT 630*  MCV 76.8*  MCH 23.2*  MCHC 30.1  RDW 24.3*  LYMPHSABS 1.3  MONOABS 0.8  EOSABS 0.0  BASOSABS 0.0    Chemistries  Recent Labs  Lab 08/23/2019 2152  NA 135  K 3.3*  CL 99  CO2 23  GLUCOSE 114*  BUN 24*  CREATININE 0.91  CALCIUM 8.1*   ------------------------------------------------------------------------------------------------------------------ No results for input(s): CHOL, HDL, LDLCALC, TRIG, CHOLHDL, LDLDIRECT in the last 72 hours.  No results found for: HGBA1C ------------------------------------------------------------------------------------------------------------------ No results for input(s): TSH, T4TOTAL, T3FREE, THYROIDAB in the last 72 hours.  Invalid input(s): FREET3 ------------------------------------------------------------------------------------------------------------------ No results for input(s): VITAMINB12, FOLATE, FERRITIN, TIBC, IRON, RETICCTPCT in the last 72 hours.  Coagulation profile No results for input(s): INR, PROTIME in the last 168 hours.  No results for input(s): DDIMER  in the last 72 hours.  Cardiac Enzymes No results for input(s): CKMB, TROPONINI, MYOGLOBIN in the last 168 hours.  Invalid input(s): CK ------------------------------------------------------------------------------------------------------------------ No results found for: BNP   Roxan Hockey M.D on 08/27/2019 at 5:35 PM  Go to [email or web address] - for contact info  [facility] - Office  [phone]

## 2019-08-27 NOTE — Care Management Important Message (Signed)
Important Message  Patient Details  Name: Todd Keith MRN: [id number] Date of Birth: Dec 07, 1940   Medicare Important Message Given:  Yes     [name] 08/27/2019, 2:28 PM

## 2019-08-27 NOTE — TOC Progression Note (Addendum)
Transition of Care [facility]) - Progression Note    Patient Details  Name: DENALI BECVAR MRN: [id number] Date of Birth: [birth year]  [facility] CM/SW Contact  Boneta Lucks, RN Phone Number: 08/27/2019, 3:35 PM  Clinical Narrative:   Hospice will accept the patient for inpatient. They do not currently have a bed. He will be GIP.  Daughter will leave her home at 4pm to come to the hospital per DSS.  She realizes she needs to sign consent. Colletta [place] with DSS went to patient home, Lattie Haw - daughter was not there.  They will not open a case if consent is signed for patient to go to inpatient hospice. Per Palliative he is active dying.  Addendum :  Hospice sent over consent forms. TOC met with Lattie Haw - daughter, forms signed and faxed to Hospice.   Expected Discharge Plan: Home w Hospice Care Barriers to Discharge: ED Unable to Make Contact with Facility/Family, ED Claims of Negligence on the Part of Facility/Family  Expected Discharge Plan and Services Expected Discharge Plan: [place] In-house Referral: Clinical Social Work, Hospice / [place] Acute Care Choice: Hospice Living arrangements for the past 2 months: Single Family Home Expected Discharge Date: 08/27/19

## 2019-08-27 NOTE — Consult Note (Signed)
Consultation Note Date: 08/27/2019   Patient Name: Todd Keith  DOB: 12/16/1940  MRN: [id number]  Age / Sex: 79 y.o., male  PCP: [facility] Referring Physician: Roxan Hockey, MD  Reason for Consultation: Establishing goals of care and Psychosocial/spiritual support  HPI/Patient Profile: 79 y.o. male  with past medical history of end-stage metastatic prostate cancer declining further chemo or radiation had radiation seed implant April 2019, COPD continues smoking cigars, smoked for the past 60 years, anxiety and depression, arthritis, GERD, anterior cervical discectomy fusion 2014, admitted on 08/26/2019 with stage IV metastatic prostate cancer with significant pain.   Clinical Assessment and Goals of Care: Todd Keith is lying quietly in bed.  He is unable to communicate his basic needs.  He appears acutely/chronically ill and quite frail, although he does appear somewhat comfortable.  There is no family at bedside at this time.  Todd Keith had been working with his cancer doctor to receive hospice care.  Per notes, he was understanding and accepting of his chronic illness burden.  Hospice provider went to Todd Keith but did not feel this was a safe environment, and therefore did not continue services.  Adult presents detective services have been called.  Call to daughter, Todd Keith.  We talked about Todd Keith acute and chronic health concerns.  Todd Keith tells me that she is wanting help with getting patient to lawyer,, getting the keys to his car.  We talk about how sick [name] is, I share that he is unable to lift his head from the bed.  I share that he is dying, and Todd Keith states, "I know that, and no one has come to house to help" them.  She is tearful.  We talk about residential hospice, and best way to care for Todd Keith as he is nearing end-of-life.   Todd Keith tells me that she  sent Todd Keith to the hospital because of pickups, she is tearful as we talked about his declines in imminent passing.  We talk about his advancing cancer, and Doctors suggesting hospice care, that time was likely short.  Todd Keith talks about all different places, hospitals she has sent Todd Keith.  She tells me, "I figured out he's dying".  Stating that he had not been eating there at homes for four days.    Todd Keith returns to talking about a will, living in "my grandfathers house" and loosing the house.  She talks about her 2 brothers, one serving life in prison and the other likely to be imprisoned soon. Again, Todd Keith asks about having a paper to her father to sign, and I share that Todd Keith cannot hold a pen.  Todd Keith talks about caring for her mother at [facility] with hospice care, her mother died 25-Jan-2015.    I ask if Todd Keith would like to come see Todd Keith, and she doesn't answer.  She does later state she will "get my son up", as he is asleep at 3:00 in the afternoon.   Todd Keith  is tearful during our conversation, often unable to stay focused on the plan of care for Todd Keith.  She frequently returns to her concerns over "losing the house".  I encouraged Todd Keith to answer the phone when hospice calls.  She tells me that she is received several phone calls that she has not answered.  I again, encouraged her to answer phone calls as they may be related to caring for Todd Keith.  Conference with attending, bedside nursing staff, transition of care team related to patient condition, needs, goals of care, residential hospice referral needs.  HCPOA  NEXT OF KIN -daughter, Todd Keith.  One adult son is serving life in prison, and the other is awaiting trial, per Todd Keith.      SUMMARY OF RECOMMENDATIONS   Full comfort care, residential hospice with [facility] in [place].  Code Status/Advance Care Planning:  DNR  Symptom Management:   Per hospitalist, no additional needs at  this time.  Palliative Prophylaxis:   Frequent Pain Assessment, Oral Care and Turn Reposition  Additional Recommendations (Limitations, Scope, Preferences):  Full Comfort Care  Psycho-social/Spiritual:   Desire for further Chaplaincy support:no  Additional Recommendations: Caregiving  Support/Resources and Education on Hospice  Prognosis:   < 2 weeks anticipated based on chronic illness burden, advancing cancer, frailty, poor to none by mouth intake.  Discharge Planning: Requesting comfort and dignity at end-of-life, residential hospice with [facility] in [place]      Primary Diagnoses: Present on Admission: . Hypotension, unspecified . Abdominal pain . Malignant neoplasm of prostate ([place]) . Self-care deficit . Prostate cancer metastatic to multiple sites [facility])   I have reviewed the medical record, interviewed the patient and family, and examined the patient. The following aspects are pertinent.  Past Medical History:  Diagnosis Date  . Anxiety   . Arthritis    hips and back  . COPD (chronic obstructive pulmonary disease) ([place])   . Depression   . Dyspnea on exertion   . Floater, vitreous, left    post op cataract surgery 2017  . GERD (gastroesophageal reflux disease)   . Headache(784.0)   . History of retinal hemorrhage    post op cataract surgery left eye 2017 approx.,  resolved  . Mild sleep apnea    per pt had sleep study approx. was told had mild osa and no cpap recommended  . Neuropathy, peripheral    feet and up legs  . 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 Nocturia   . Prostate cancer [facility]) urologist-  dr eskridge/  oncologist-  dr Tammi Klippel   dx 11-09-2016  Stage T2a, Gleason 3+4, PSA 5.91, vol 32.93g-- scheduled for radioactive seed implants  . Wears glasses    Social History   Socioeconomic History  . Marital status: Widowed    Spouse name: Not on file  . Number of children: 3  . Years of education: Not on file  . Highest education level: Not on file    Occupational History  . Occupation: retired    Comment: Architect  Tobacco Use  . Smoking status: Current Some Day Smoker    Years: 60.00    Types: Cigars  . Smokeless tobacco: Never Used  . Tobacco comment: down to 1-2 cigars per day (the small ones)  Vaping Use  . Vaping Use: Never used  Substance and Sexual Activity  . Alcohol use: No  . Drug use: No  . Sexual activity: Never    Comment: wife died in  2017  Other Topics Concern  . Not on file  Social History Narrative   Worked in Architect. Retired now. Wife died 1 year ago.    Social Determinants of Health   Financial Resource Strain:   . Difficulty of Paying Living Expenses:   Food Insecurity:   . Worried About Charity fundraiser in the Last Year:   . Arboriculturist in the Last Year:   Transportation Needs:   . Film/video editor (Medical):   Marland Kitchen Lack of Transportation (Non-Medical):   Physical Activity:   . Days of Exercise per Week:   . Minutes of Exercise per Session:   Stress:   . Feeling of Stress :   Social Connections:   . Frequency of Communication with Friends and Family:   . Frequency of Social Gatherings with Friends and Family:   . Attends Religious Services:   . Active Member of Clubs or Organizations:   . Attends Archivist Meetings:   Marland Kitchen Marital Status:    Family History  Problem Relation Age of Onset  . Cancer Mother        breast  . Cancer Father        unknown   Scheduled Meds: . alfuzosin  10 mg Oral Daily  . DULoxetine  60 mg Oral QPM  . heparin  5,000 Units Subcutaneous Q8H  . mometasone-formoterol  2 puff Inhalation BID  . pantoprazole  40 mg Oral Daily   Continuous Infusions: . sodium chloride 50 mL/hr at 08/26/19 2007   PRN Meds:.acetaminophen **OR** acetaminophen, albuterol, HYDROmorphone (DILAUDID) injection, LORazepam, ondansetron **OR** ondansetron (ZOFRAN) IV, ondansetron, oxyCODONE, zolpidem Medications Prior to Admission:  Prior to Admission  medications   Medication Sig Start Date End Date Taking? Authorizing Provider  albuterol (PROAIR HFA) 108 (90 BASE) MCG/ACT inhaler Inhale 2 puffs into the lungs every 4 (four) hours as needed.    Yes [provider]  alfuzosin (UROXATRAL) 10 MG 24 hr tablet Take 10 mg by mouth daily. 08/07/19  Yes [provider]  buprenorphine (BUTRANS) 20 MCG/HR PTWK Place 1 patch onto the skin once a week. 07/21/19  Yes [provider]  clonazePAM (KLONOPIN) 1 MG tablet Take 1 mg by mouth 2 (two) times daily as needed for anxiety.  07/20/17  Yes [provider]  DULoxetine (CYMBALTA) 60 MG capsule Take 60 mg by mouth every evening.    Yes [provider]  eszopiclone (LUNESTA) 2 MG TABS tablet Take 2 mg by mouth at bedtime. Take immediately before bedtime   Yes [provider]  Fluticasone-Salmeterol (ADVAIR DISKUS) 250-50 MCG/DOSE AEPB Inhale 1 puff into the lungs 2 (two) times daily.  10/08/14  Yes [provider]  furosemide (LASIX) 20 MG tablet Take 20 mg by mouth daily. 07/10/19  Yes [provider]  levocetirizine (XYZAL) 5 MG tablet Take 5 mg by mouth daily.  07/16/17  Yes [provider]  loratadine (CLARITIN) 10 MG tablet Take 5 mg by mouth every evening.    Yes [provider]  NARCAN 4 MG/0.1ML LIQD nasal spray kit  07/20/17  Yes [provider]  ondansetron (ZOFRAN ODT) 4 MG disintegrating tablet Take 1 tablet (4 mg total) by mouth every 8 (eight) hours as needed. 08/11/19  Yes Long, Wonda Olds, MD  oxyCODONE (OXY IR/ROXICODONE) 5 MG immediate release tablet Take 5 mg by mouth every 4 (four) hours as needed for pain. 08/22/19  Yes [provider]  pantoprazole ([place])  40 MG tablet Take 40 mg by mouth daily. 03/13/19  Yes [provider]  Otho 2.5 MCG/ACT AERS  07/19/17   [provider]   Allergies  Allergen Reactions  . Oxycodone Itching   Review of Systems  Unable to  perform ROS: Acuity of condition    Physical Exam Vitals and nursing note reviewed.  Constitutional:      General: He is in acute distress.     Appearance: He is ill-appearing.  Cardiovascular:     Rate and Rhythm: Normal rate.  Pulmonary:     Effort: Pulmonary effort is normal. No respiratory distress.  Abdominal:     General: Abdomen is protuberant. There is distension.     Palpations: Abdomen is rigid.  Skin:    General: Skin is warm and dry.  Neurological:     Comments: Does not respond in any meaningful way to voice or touch     Vital Signs: BP 91/60 (BP Location: Left Arm)   Pulse (!) 109   Temp 98.1 F (36.7 C) (Axillary)   Resp 18   Ht '5\' 11"'$  (1.803 m)   Wt 45.8 kg   SpO2 (!) 83%   BMI 14.09 kg/m  Pain Scale: Faces POSS *See Group Information*: 1-Acceptable,Awake and alert Pain Score: 10-Worst pain ever   SpO2: SpO2: (!) 83 % O2 Device:SpO2: (!) 83 % O2 Flow Rate: .O2 Flow Rate (L/min): 4 L/min  IO: Intake/output summary:   Intake/Output Summary (Last 24 hours) at 08/27/2019 1429 Last data filed at 08/27/2019 1342 Gross per 24 hour  Intake 1344.12 ml  Output --  Net 1344.12 ml    LBM: Last BM Date: 08/26/19 Baseline Weight: Weight: 64 kg Most recent weight: Weight: 45.8 kg     Palliative Assessment/Data:   Flowsheet Rows     Most Recent Value  Intake Tab  Referral Department Hospitalist  Unit at Time of Referral Cardiac/Telemetry Unit  Palliative Care Primary Diagnosis Cancer  Date Notified 08/18/2019  Palliative Care Type New Palliative care  Reason for referral Clarify Goals of Care  Date of Admission 08/19/2019  Date first seen by Palliative Care 08/27/19  # of days Palliative referral response time 3 Day(s)  # of days IP prior to Palliative referral 0  Clinical Assessment  Palliative Performance Scale Score 20%  Pain Max last 24 hours Not able to report  Pain Min Last 24 hours Not able to report  Dyspnea Max Last 24 Hours Not able to  report  Dyspnea Min Last 24 hours Not able to report  Psychosocial & Spiritual Assessment  Palliative Care Outcomes      Time In:    0900   -  1430 Time Out: 0920  -  1520 Time Total: 20   + 50  = 70 minutes  Greater than 50%  of this time was spent counseling and coordinating care related to the above assessment and plan.  Signed by: Drue Novel, NP   Please contact Palliative Medicine Team phone at [phone] for questions and concerns.  For individual provider: See Shea Evans

## 2019-08-27 NOTE — Progress Notes (Signed)
Tele called with rate change - afib with rvr rate 120s Pt alert, verbal, asymptomatic Dr. Joesph Fillers notified with nno at this time Will cont to monitor

## 2019-08-28 ENCOUNTER — Encounter (HOSPITAL_COMMUNITY): Payer: Self-pay | Admitting: *Deleted

## 2019-08-28 ENCOUNTER — Inpatient Hospital Stay (HOSPITAL_COMMUNITY)
Admission: RE | Admit: 2019-08-28 | Discharge: 2019-09-06 | DRG: 951 | Disposition: E | Source: Intra-hospital | Attending: Family Medicine | Admitting: Family Medicine

## 2019-08-28 DIAGNOSIS — Z66 Do not resuscitate: Secondary | ICD-10-CM | POA: Diagnosis present

## 2019-08-28 DIAGNOSIS — R1084 Generalized abdominal pain: Secondary | ICD-10-CM

## 2019-08-28 DIAGNOSIS — C189 Malignant neoplasm of colon, unspecified: Secondary | ICD-10-CM | POA: Diagnosis present

## 2019-08-28 DIAGNOSIS — F419 Anxiety disorder, unspecified: Secondary | ICD-10-CM | POA: Diagnosis present

## 2019-08-28 DIAGNOSIS — Z981 Arthrodesis status: Secondary | ICD-10-CM

## 2019-08-28 DIAGNOSIS — I959 Hypotension, unspecified: Secondary | ICD-10-CM | POA: Diagnosis present

## 2019-08-28 DIAGNOSIS — J449 Chronic obstructive pulmonary disease, unspecified: Secondary | ICD-10-CM | POA: Diagnosis present

## 2019-08-28 DIAGNOSIS — E861 Hypovolemia: Secondary | ICD-10-CM | POA: Diagnosis present

## 2019-08-28 DIAGNOSIS — E860 Dehydration: Secondary | ICD-10-CM | POA: Diagnosis present

## 2019-08-28 DIAGNOSIS — Z7189 Other specified counseling: Secondary | ICD-10-CM

## 2019-08-28 DIAGNOSIS — Z515 Encounter for palliative care: Principal | ICD-10-CM | POA: Diagnosis present

## 2019-08-28 DIAGNOSIS — G8929 Other chronic pain: Secondary | ICD-10-CM | POA: Diagnosis present

## 2019-08-28 DIAGNOSIS — Z923 Personal history of irradiation: Secondary | ICD-10-CM

## 2019-08-28 DIAGNOSIS — C61 Malignant neoplasm of prostate: Secondary | ICD-10-CM | POA: Diagnosis present

## 2019-08-28 DIAGNOSIS — R109 Unspecified abdominal pain: Secondary | ICD-10-CM | POA: Diagnosis present

## 2019-08-28 DIAGNOSIS — K56600 Partial intestinal obstruction, unspecified as to cause: Secondary | ICD-10-CM | POA: Diagnosis present

## 2019-08-28 DIAGNOSIS — I9589 Other hypotension: Secondary | ICD-10-CM | POA: Diagnosis present

## 2019-08-28 DIAGNOSIS — Z803 Family history of malignant neoplasm of breast: Secondary | ICD-10-CM

## 2019-08-28 DIAGNOSIS — K5669 Other partial intestinal obstruction: Secondary | ICD-10-CM

## 2019-08-28 DIAGNOSIS — F17290 Nicotine dependence, other tobacco product, uncomplicated: Secondary | ICD-10-CM | POA: Diagnosis present

## 2019-08-28 DIAGNOSIS — K219 Gastro-esophageal reflux disease without esophagitis: Secondary | ICD-10-CM | POA: Diagnosis present

## 2019-08-28 DIAGNOSIS — C799 Secondary malignant neoplasm of unspecified site: Secondary | ICD-10-CM | POA: Diagnosis present

## 2019-08-28 MED ORDER — POLYVINYL ALCOHOL 1.4 % OP SOLN: 1.0000 [drp] | Freq: Four times a day (QID) | OPHTHALMIC | Status: DC | PRN

## 2019-08-28 MED ORDER — BIOTENE DRY MOUTH MT LIQD: 15.0000 mL | OROMUCOSAL | Status: DC | PRN

## 2019-08-28 MED ORDER — ACETAMINOPHEN 325 MG PO TABS: 650.0000 mg | ORAL_TABLET | Freq: Four times a day (QID) | ORAL | Status: DC | PRN

## 2019-08-28 MED ORDER — HALOPERIDOL LACTATE 2 MG/ML PO CONC: 0.5000 mg | ORAL | Status: DC | PRN

## 2019-08-28 MED ORDER — ACETAMINOPHEN 650 MG RE SUPP: 650.0000 mg | Freq: Four times a day (QID) | RECTAL | Status: DC | PRN

## 2019-08-28 MED ORDER — HALOPERIDOL 0.5 MG PO TABS: 0.5000 mg | ORAL_TABLET | ORAL | Status: DC | PRN

## 2019-08-28 MED ORDER — ONDANSETRON 4 MG PO TBDP: 4.0000 mg | ORAL_TABLET | Freq: Four times a day (QID) | ORAL | Status: DC | PRN

## 2019-08-28 MED ORDER — ONDANSETRON HCL 4 MG/2ML IJ SOLN: 4.0000 mg | Freq: Four times a day (QID) | INTRAMUSCULAR | Status: DC | PRN

## 2019-08-28 MED ORDER — HALOPERIDOL LACTATE 5 MG/ML IJ SOLN: 0.5000 mg | INTRAMUSCULAR | Status: DC | PRN

## 2019-08-28 MED ORDER — GLYCOPYRROLATE 0.2 MG/ML IJ SOLN: 0.2000 mg | INTRAMUSCULAR | Status: DC | PRN

## 2019-08-28 MED ORDER — GLYCOPYRROLATE 1 MG PO TABS: 1.0000 mg | ORAL_TABLET | ORAL | Status: DC | PRN

## 2019-09-06 NOTE — Discharge Summary (Signed)
Todd Keith, is a 79 y.o. male  DOB [birth year]  MRN [id number].  Admission date:  08/21/2019  Admitting Physician  Roxan Hockey, MD  Discharge Date:  02-Sep-2019   Primary MD  Pllc, [facility]  Recommendations for primary care physician for things to follow:   -Palliative care consult was obtained, hospice consult was obtained, patient will transition to full comfort care with hospice taking over care -Patient was discharged from medical service on 09/02/2019 -Patient was then admitted to hospice service/GIP on 6/22/   Admission Diagnosis  Hypotension, unspecified [I95.9] Dehydration [E86.0] Abdominal pain, unspecified abdominal location [R10.9] Metastatic malignant neoplasm, unspecified site [facility]) [C79.9] Prostate cancer metastatic to multiple sites [facility]) [C61]   Discharge Diagnosis  Hypotension, unspecified [I95.9] Dehydration [E86.0] Abdominal pain, unspecified abdominal location [R10.9] Metastatic malignant neoplasm, unspecified site [facility]) [C79.9] Prostate cancer metastatic to multiple sites [facility]) [C61]    Principal Problem:   Hypotension, unspecified Active Problems:   Malignant neoplasm of prostate ([place])   Abdominal pain   Self-care deficit   Prostate cancer metastatic to multiple sites [facility])   Goals of care, counseling/discussion   Palliative care by specialist   Encounter for hospice care discussion      Past Medical History:  Diagnosis Date  . Anxiety   . Arthritis    hips and back  . COPD (chronic obstructive pulmonary disease) ([place])   . Depression   . Dyspnea on exertion   . Floater, vitreous, left    post op cataract surgery 2017  . GERD (gastroesophageal reflux disease)   . Headache(784.0)   . History of retinal hemorrhage    post op cataract surgery left eye 2017 approx.,  resolved  . Mild sleep apnea    per pt had sleep study approx. was told had mild  osa and no cpap recommended  . Neuropathy, peripheral    feet and up legs  . 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 Nocturia   . Prostate cancer [facility]) urologist-  dr eskridge/  oncologist-  dr Tammi Klippel   dx 11-09-2016  Stage T2a, Gleason 3+4, PSA 5.91, vol 32.93g-- scheduled for radioactive seed implants  . Wears glasses     Past Surgical History:  Procedure Laterality Date  . ANTERIOR CERVICAL DECOMP/DISCECTOMY FUSION N/A 06/05/2012   Procedure: Cervical six - seven  ANTERIOR CERVICAL DECOMPRESSION/DISCECTOMY FUSION 1 LEVEL REMOVAL OF HARDWARE Cervical four - six;  Surgeon: Charlie Pitter, MD;  Location: [place] [facility];  Service: Neurosurgery;  Laterality: N/A;  . CATARACT EXTRACTION W/ INTRAOCULAR LENS IMPLANT Left 2017  approx.  Marland Kitchen CATARACT EXTRACTION W/PHACO Right 05/15/2018   Procedure: CATARACT EXTRACTION PHACO AND INTRAOCULAR LENS PLACEMENT ([place]);  Surgeon: Baruch Goldmann, MD;  Location: [facility];  Service: Ophthalmology;  Laterality: Right;  CDE: 25.69  . CERVICAL DISC SURGERY  1990s and 2004  approx.   C4-6  . PROSTATE BIOPSY  11-09-2016   dr eskridge office  . RADIOACTIVE SEED IMPLANT N/A 07/05/2017   Procedure: RADIOACTIVE SEED IMPLANT/BRACHYTHERAPY IMPLANT;  Surgeon: Festus Aloe, MD;  Location[facility];  Service: Urology;  Laterality: N/A;  ONLY NEEDS 120 MIN FOR BOTH PROCEDURES  . SPACE OAR INSTILLATION N/A 07/05/2017   Procedure: SPACE OAR INSTILLATION;  Surgeon: Festus Aloe, MD;  Location: [facility];  Service: Urology;  Laterality: N/A;     HPI  from the history and physical done on the day of admission:    Chief Complaint: Abdominal pain  HPI: Todd Keith is a 79 y.o. male with medical history significant of metastatic prostate cancer is brought to ED for severe generalized weakness and abdominal pain.  Patient cannot take care of himself and the family members are also not willing to help him.  Patient states that his health condition is  declining very quickly day by day and he also declined chemotherapy because he understands that he is having incurable disease and will die soon.  Patient admits of abdominal pain, generalized weakness and dyspnea with exertion but otherwise denies fever, chills, chest pain and urinary symptoms.    ED Course: Patient is angling in the ED for more than 24 hours and transition care team also contacted and they are working on the placement in hospice care.  While in the ED patient became severely hypotensive with worsening abdominal pain and started on IV fluids and pain medications.  Review of Systems: As per HPI otherwise 10 point review of systems negative.      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 with colonic obstruction and persistent emesis and inability to tolerate oral intake-admitted on 08/26/2019 for pain control and IV hydration -Palliative care consult was obtained, hospice consult was obtained, patient will transition to full comfort care with hospice taking over care -Patient was discharged from medical service on 09-11-2019   A/p 1) 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 with colonic obstruction--- patient with abdominal distention, abdominal pain, nausea vomiting, unable to tolerate oral intake -Palliative care consult was obtained, hospice consult was obtained, patient will transition to full comfort care with hospice taking over care -Full comfort care orders initiated -  2)Stage IVMetastaticProstateCancer-- -comfort care orders --palliative careinput appreciated  --Awaiting possible transfer to residential hospice  3)hypotension due to  hypovolemia and dehydration--- very poor oral intake - 4)social/ethics---DNR/DNI, patient already declined aggressive treatment modalities, declined further oncology input transitioned to hospice and comfort care    Disposition ---patient is now under the care of the hospice team----awaiting transition to residential hospice  Status is: Inpatient  Remains inpatient appropriate because:Unsafe discharge plan, awaiting transfer to residential hospice   Disposition: The patient is from:Home Anticipated d/c is LP:[id number] hospice Anticipated d/c date is: 1 day Patient currently is  stable to d/c-to hospice house Barriers:Awaiting transfer to residential hospice Code Status:DNR   Family Communication:Discussed withdaughter Lattie Haw Consults :Palliative/Hospice  DVT Prophylaxis: - SCDs(comfort care)  Discharge Condition: Grave prognosis, patient is actively dying  Follow UP   Contact information for after-discharge care    [place] .   Service: Inpatient Hospice Contact information: [address] [place] [phone]                   Consults obtained -palliative  care on hospice  Diet and Activity recommendation:  As advised  Discharge Instructions    --Transfer to residential hospice house     Discharge Medications     Allergies as of September 25, 2019      Reactions   Oxycodone Itching      Medication List    ASK your doctor about these medications   Advair Diskus 250-50 MCG/DOSE Aepb Generic drug: Fluticasone-Salmeterol Inhale 1 puff into the lungs 2 (two) times daily.   alfuzosin 10 MG 24 hr tablet Commonly known as: UROXATRAL Take 10 mg by mouth daily.   buprenorphine 20 MCG/HR Ptwk Commonly known as: BUTRANS Place 1 patch onto the skin once a week.   clonazePAM 1 MG tablet Commonly known as: KLONOPIN Take 1 mg by  mouth 2 (two) times daily as needed for anxiety.   DULoxetine 60 MG capsule Commonly known as: CYMBALTA Take 60 mg by mouth every evening.   eszopiclone 2 MG Tabs tablet Commonly known as: LUNESTA Take 2 mg by mouth at bedtime. Take immediately before bedtime   furosemide 20 MG tablet Commonly known as: LASIX Take 20 mg by mouth daily.   levocetirizine 5 MG tablet Commonly known as: XYZAL Take 5 mg by mouth daily.   loratadine 10 MG tablet Commonly known as: CLARITIN Take 5 mg by mouth every evening.   Narcan 4 MG/0.1ML Liqd nasal spray kit Generic drug: naloxone   ondansetron 4 MG disintegrating tablet Commonly known as: Zofran ODT Take 1 tablet (4 mg total) by mouth every 8 (eight) hours as needed.   oxyCODONE 5 MG immediate release tablet Commonly known as: Oxy IR/ROXICODONE Take 5 mg by mouth every 4 (four) hours as needed for pain.   pantoprazole 40 MG tablet Commonly known as: PROTONIX Take 40 mg by mouth daily.   ProAir HFA 108 (90 Base) MCG/ACT inhaler Generic drug: albuterol Inhale 2 puffs into the lungs every 4 (four) hours as needed.   Spiriva Respimat 2.5 MCG/ACT Aers Generic drug: Tiotropium Bromide Monohydrate       Major procedures and Radiology Reports - PLEASE review detailed and final reports for all details, in brief -    CT ABDOMEN PELVIS W CONTRAST  Result Date: 08/11/2019 CLINICAL DATA:  Abdominal distension. Bowel obstruction suspected. Patient reportedly has multiple types of cancer. EXAM: CT ABDOMEN AND PELVIS WITH CONTRAST TECHNIQUE: Multidetector CT imaging of the abdomen and pelvis was performed using the standard protocol following bolus administration of intravenous contrast. CONTRAST:  46m OMNIPAQUE IOHEXOL 300 MG/ML  SOLN COMPARISON:  CT, 07/26/2019. FINDINGS: Lower chest: Right greater than left lower lobe lung base opacity, consistent with atelectasis and similar to the prior exam. No acute findings. Hepatobiliary: No focal liver  abnormality is seen. No gallstones, gallbladder wall thickening, or biliary dilatation. Pancreas: Unremarkable. No pancreatic ductal dilatation or surrounding inflammatory changes. Spleen: Normal in size without focal abnormality. Adrenals/Urinary Tract: No adrenal masses. Kidneys normal in overall size, orientation and position with symmetric enhancement and excretion. Bilateral renal cysts. No stones. No hydronephrosis. Normal ureters. Bladder is unremarkable. Stomach/Bowel: Colon is distended to a maximum of 7.6 cm, right transverse. There are multiple small bubbles of air within the colonic wall reflecting pneumatosis intestinalis, without wall thickening. In the distal sigmoid colon, there is an irregular area of bowel wall thickening and luminal narrowing with the sigmoid colon distal to this being decompressed. In the ascending colon there is a short segment of irregular wall thickening and luminal narrowing. There are scattered  colonic diverticula without evidence of diverticulitis. Stomach is unremarkable.  Normal small bowel. There are multiple omental and mesenteric masses/nodules, largest along the right peritoneal cavity, largest discrete nodule measuring 3.4 cm. The largest nodule on the left measures 2.7 cm. These are similar to the prior CT. Vascular/Lymphatic: Aortic atherosclerosis.  No aneurysm. No enlarged lymph nodes. Reproductive: Multiple radiation therapy seeds along the prostate and prostate bed, stable. Other: Small amount of ascites increased compared to the prior CT. Musculoskeletal: No fracture or acute finding. No osteoblastic or osteolytic lesions. IMPRESSION: 1. Omental and peritoneal metastatic disease, which is similar to the prior CT. 2. Partial distal colonic obstruction due to irregular wall thickening and luminal narrowing of the distal sigmoid colon, which is similar to the prior CT, suspicious for colonic malignancy. There is also a short segment focus of colonic wall  thickening and luminal narrowing of the ascending colon, also similar to the prior CT, and suspicious for a second focus of colon carcinoma. 3. Mild right colon pneumatosis intestinalis without wall thickening. No portal venous air. 4. Small amount of ascites that has increased in amount from the previous CT. Electronically Signed   By: Lajean Manes M.D.   On: 08/11/2019 06:48   DG Chest Port 1 View  Result Date: 08/11/2019 CLINICAL DATA:  Per triage note: Pt has multiple types of Cancer, has been constipated for several days and having severe stomach pain. Tonight the pain worsened and he is now also c/o sob, possibly r/t the pain. Pt states no BM x 5 days EXAM: PORTABLE CHEST 1 VIEW COMPARISON:  04/25/2019 FINDINGS: Cardiac silhouette is normal in size. No mediastinal or hilar masses. Low lung volumes. There is opacity at the lung bases, right greater than left, consistent with atelectasis and similar to the prior exam. Remainder of the lungs is clear. No convincing pleural effusion and no pneumothorax. Skeletal structures are demineralized but grossly intact. IMPRESSION: 1. No acute findings. 2. Lung base opacity, right greater than left, consistent with atelectasis and similar to the prior radiographs. Electronically Signed   By: Lajean Manes M.D.   On: 08/11/2019 06:49    Micro Results    Recent Results (from the past 240 hour(s))  SARS Coronavirus 2 by RT PCR (hospital order, performed in [facility] hospital lab) Nasopharyngeal Nasopharyngeal Swab     Status: None   Collection Time: 08/10/2019  9:33 PM   Specimen: Nasopharyngeal Swab  Result Value Ref Range Status   SARS Coronavirus 2 NEGATIVE NEGATIVE Final    Comment: (NOTE) SARS-CoV-2 target nucleic acids are NOT DETECTED.  The SARS-CoV-2 RNA is generally detectable in upper and lower respiratory specimens during the acute phase of infection. The lowest concentration of SARS-CoV-2 viral copies this assay can detect is 250 copies / mL.  A negative result does not preclude SARS-CoV-2 infection and should not be used as the sole basis for treatment or other patient management decisions.  A negative result may occur with improper specimen collection / handling, submission of specimen other than nasopharyngeal swab, presence of viral mutation(s) within the areas targeted by this assay, and inadequate number of viral copies (<250 copies / mL). A negative result must be combined with clinical observations, patient history, and epidemiological information.  Fact Sheet for Patients:   StrictlyIdeas.no  Fact Sheet for Healthcare Providers: BankingDealers.co.za  This test is not yet approved or  cleared by the [place] FDA and has been authorized for detection and/or diagnosis of SARS-CoV-2 by FDA under  an Emergency Use Authorization (EUA).  This EUA will remain in effect (meaning this test can be used) for the duration of the COVID-19 declaration under Section 564(b)(1) of the Act, 21 U.S.C. section 360bbb-3(b)(1), unless the authorization is terminated or revoked sooner.  Performed at [facility], [address], [place], [place] [address]        Today   [place] today--- actively dying          Patient has been seen and examined prior to discharge   Objective   Blood pressure (!) 82/55, pulse 64, temperature (!) 97.1 F (36.2 C), resp. rate (!) 21, height _0  (1.803 m), weight 45.8 kg, SpO2 (!) 79 %.   Intake/Output Summary (Last 24 hours) at 09-19-2019 1001 Last data filed at 09-19-19 0612 Gross per 24 hour  Intake 0 ml  Output 800 ml  Net -800 ml    Exam Gen:- Awake Alert,cachectic appearing, more comfortable, HEENT:- [place].AT, No sclera icterus Neck-Supple Neck,No JVD,.  Lungs- CTAB , fair symmetrical air movement CV- S1, S2 normal, regular  Abd- +ve B.Sounds, Abd Soft, No tenderness,  distended Extremity/Skin:- No  edema, pedal pulses present  Psych-affect is flat, sleepy  neuro-generalized weakness somewhat lethargic after pain medications   Data Review   CBC w Diff:  Lab Results  Component Value Date   WBC 10.2 08/30/2019   HGB 10.4 (L) 08/08/2019   HCT 34.5 (L) 08/20/2019   PLT 630 (H) 08/23/2019   LYMPHOPCT 13 08/17/2019   MONOPCT 8 08/19/2019   EOSPCT 0 09/05/2019   BASOPCT 0 08/21/2019    CMP:  Lab Results  Component Value Date   NA 135 08/23/2019   K 3.3 (L) 08/25/2019   CL 99 08/27/2019   CO2 23 09/02/2019   BUN 24 (H) 08/11/2019   CREATININE 0.91 08/14/2019   PROT 7.3 08/11/2019   ALBUMIN 3.6 08/11/2019   BILITOT 0.7 08/11/2019   ALKPHOS 61 08/11/2019   AST 19 08/11/2019   ALT 11 08/11/2019  .   Total Discharge time is about 33 minutes  Roxan Hockey M.D on 19-Sep-2019 at 10:01 AM  Go to [email or web address] -  for contact info  [facility] - Office  [phone]

## 2019-09-06 NOTE — Progress Notes (Signed)
Sometime around the 2 o'clock hour pt vomited over right side of bed onto bed and onto the floor a large amount of brown liquid. The smell coming from emesis mirrors the smell of stool. Gave patient zofran and ativan IV and got pt and room cleaned up.

## 2019-09-06 NOTE — Discharge Summary (Signed)
Todd Keith:[id number] DOB: 03/06/1941 DOA: 09/19/2019  PCP: Alanson Puls [facility] PCP/Office notified:   Admit date: 09/19/19 Date of Death: 2019-09-20  Final Diagnoses:  Principal Problem:   Colon cancer [name]) Active Problems:   Encounter for hospice care   Partial bowel obstruction due to Colon malignancy   Hypotension, unspecified   Abdominal pain   Prostate cancer metastatic to multiple sites [facility])   Goals of care, counseling/discussion    History of present illness:  Brief Summary:- 79 y.o.malewith medical history significant ofmetastatic prostate cancer,COPD, depression, GERD, chronic pain with prior anterior cervical discectomy fusion in 2014is brought to ED for severe generalized weakness and abdominal painand found to have hypotension in the setting of 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 with colonic obstruction and persistent emesis and inability to tolerate oral intake-admitted on 08/26/2019 for pain control and IV hydration -Palliative care consult was obtained, hospice consult was obtained, patient will transition to full comfort care with hospice taking over care -Patient was discharged from medical service on 09-19-19 -Patient was then admitted to hospice service/GIP on 09/19/19  Hospital Course:  A/p 1) 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 with colonic obstruction--- patient with abdominal distention, abdominal pain, nausea vomiting, unable to tolerate oral intake -Palliative care consult was obtained, hospice consult was obtained, patient will transition to full comfort care with hospice taking over care -Full comfort care orders initiated -Patient died on 09-19-2019 at 34 am   2)Stage IVMetastaticProstateCancer-- -comfort care orders --palliative careand hospice team input appreciated  --Patient died on 09/19/2019 at 27 am  While Awaiting possible transfer to residential hospice  3)hypotension due to hypovolemia and  dehydration--- very poor oral intake - 4)social/ethics---DNR/DNI, patient already declined aggressive treatment modalities, declined further oncology input transitioned to hospice and comfort care   Time: Patient died on 19-Sep-2019 at 1004 am   Signed:  Ayliana Casciano  [facility] 09-20-2019, 10:31 AM

## 2019-09-06 NOTE — Progress Notes (Signed)
Pt thrashing and yelling out in bed. Pain medicine given at 5:52am. Pt starting to look grey and work of breathing more labored. Placed on 2L O2 for comfort. Pt abdomen more distended and obtunded than earlier in the night. NT informed me that BP was low at 82/55(63) and she was having a hard time getting an oxygen reading. Pts hands are cold but O@ sat reading between 79 and 88 on 2L O2. Pt seems more comfortable after pain medication. Pt was made DNR/comfort care yesterday and is awaiting hospice bed or will be inpatient hospice.

## 2019-09-06 NOTE — Progress Notes (Signed)
Palliative: [name] is now full comfort care.  He is resting quietly in bed, and appears comfortable.  He is actively dying.  There is no family at bedside at this time.  Conference with attending, bedside nursing staff, transition of care team related to patient condition, symptom management, GIP status, imminent death.  Plan:   GIP status.  Full comfort care, anticipate hospital death.  68 minutes Todd Axe, NP Palliative Medicine Team Team Phone # [phone] Greater than 50% of this time was spent counseling and coordinating care related to the above assessment and plan.

## 2019-09-06 NOTE — Progress Notes (Addendum)
   Sep 01, 2019 0624  Assess: MEWS Score  Pulse Rate 64  SpO2 (!) 79 %  Assess: MEWS Score  MEWS Temp 0  MEWS Systolic 1  MEWS Pulse 0  MEWS RR 1  MEWS LOC 2  MEWS Score 4  MEWS Score Color Red  Assess: if the MEWS score is Yellow or Red  Were vital signs taken at a resting state? Yes   Hospice papers signed by daughter and pt made full comfort care; pt medicated to make comfortable.  Deirdre Pippins, RN

## 2019-09-06 NOTE — H&P (Signed)
Patient Demographics:    Todd Keith, is a 79 y.o. male  MRN: [id number]   DOB - 1940/10/28  Admit Date - 09-14-19  Outpatient Primary MD for the patient is [facility] [place]:    Principal Problem:   Colon cancer [facility]) Active Problems:   Encounter for hospice care   Partial bowel obstruction due to Colon malignancy   Hypotension, unspecified   Abdominal pain   Prostate cancer metastatic to multiple sites [facility])   Goals of care, counseling/discussion  Brief Summary:- 79 y.o.malewith medical history significant ofmetastatic prostate cancer, COPD, depression, GERD, chronic pain with prior anterior cervical discectomy fusion in 2014 is brought to ED for severe generalized weakness and abdominal pain and found to have hypotension in the setting of 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 with colonic obstruction and persistent emesis and inability to tolerate oral intake-admitted on 08/26/2019 for pain control and IV hydration -Palliative care consult was obtained, hospice consult was obtained, patient will transition to full comfort care with hospice taking over care -Patient was discharged from medical service on 2019-09-14 -Patient was then admitted to hospice service/GIP on 09/14/19  A/p 1) 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 with colonic obstruction--- patient with abdominal distention, abdominal pain, nausea vomiting, unable to tolerate oral intake -Palliative care consult was obtained, hospice consult was obtained, patient will transition to full comfort care with hospice taking over care -Full comfort care orders initiated -  2)Stage IVMetastaticProstateCancer-- -comfort care orders --palliative care input appreciated  --Awaiting possible transfer to residential  hospice    3)hypotension due to hypovolemia and dehydration--- very poor oral intake - 4)social/ethics---DNR/DNI, patient already declined aggressive treatment modalities, declined further oncology input transitioned to hospice and comfort care    Disposition ---patient is now under the care of the hospice team----awaiting transition to residential hospice  Status is: Inpatient  Remains inpatient appropriate because:Unsafe discharge plan, awaiting transfer to residential hospice   Disposition: The patient is from: Home  Anticipated d/c is to: Residential hospice  Anticipated d/c date is: 1 day  Patient currently is  stable to d/c-to hospice house Barriers: Awaiting transfer to residential hospice Code Status : DNR   Family Communication: Discussed with daughter Lattie Haw Consults  :  Palliative/Hospice  DVT Prophylaxis  :  - SCDs  (comfort care)  With History of - Reviewed by me  Past Medical History:  Diagnosis Date  . Anxiety   . Arthritis    hips and back  . COPD (chronic obstructive pulmonary disease) ([place])   . Depression   . Dyspnea on exertion   . Floater, vitreous, left    post op cataract surgery 2017  . GERD (gastroesophageal reflux disease)   . Headache(784.0)   . History of retinal hemorrhage    post op cataract surgery left eye 2017 approx.,  resolved  . Mild sleep apnea    per pt had sleep study approx.  was told had mild osa and no cpap recommended  . Neuropathy, peripheral    feet and up legs  . 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 Nocturia   . Prostate cancer [facility]) urologist-  dr eskridge/  oncologist-  dr Tammi Klippel   dx 11-09-2016  Stage T2a, Gleason 3+4, PSA 5.91, vol 32.93g-- scheduled for radioactive seed implants  . Wears glasses       Past Surgical History:  Procedure Laterality Date  . ANTERIOR CERVICAL DECOMP/DISCECTOMY FUSION N/A 06/05/2012   Procedure: Cervical six - seven  ANTERIOR CERVICAL  DECOMPRESSION/DISCECTOMY FUSION 1 LEVEL REMOVAL OF HARDWARE Cervical four - six;  Surgeon: Charlie Pitter, MD;  Location: [facility];  Service: Neurosurgery;  Laterality: N/A;  . CATARACT EXTRACTION W/ INTRAOCULAR LENS IMPLANT Left 2017  approx.  Marland Kitchen CATARACT EXTRACTION W/PHACO Right 05/15/2018   Procedure: CATARACT EXTRACTION PHACO AND INTRAOCULAR LENS PLACEMENT ([place]);  Surgeon: Baruch Goldmann, MD;  Location: [facility];  Service: Ophthalmology;  Laterality: Right;  CDE: 25.69  . CERVICAL DISC SURGERY  1990s and 2004  approx.   C4-6  . PROSTATE BIOPSY  11-09-2016   dr eskridge office  . RADIOACTIVE SEED IMPLANT N/A 07/05/2017   Procedure: RADIOACTIVE SEED IMPLANT/BRACHYTHERAPY IMPLANT;  Surgeon: Festus Aloe, MD;  Location[facility];  Service: Urology;  Laterality: N/A;  ONLY NEEDS 120 MIN FOR BOTH PROCEDURES  . SPACE OAR INSTILLATION N/A 07/05/2017   Procedure: SPACE OAR INSTILLATION;  Surgeon: Festus Aloe, MD;  Location: [facility];  Service: Urology;  Laterality: N/A;      No chief complaint on file.     HPI:    Todd Keith  is a 79 y.o. 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    Social History:  Reviewed by me    Social History   Tobacco Use  . Smoking status: Current Some Day Smoker    Years: 60.00    Types: Cigars  . Smokeless tobacco: Never Used  . Tobacco comment: down to 1-2 cigars per day (the small ones)  Substance Use Topics  . Alcohol use: No      Family History :  Reviewed by me    Family History  Problem Relation Age of Onset  . Cancer Mother        breast  . Cancer Father        unknown     Home Medications:   Prior to Admission medications   Medication Sig Start Date End Date Taking? Authorizing Provider  albuterol (PROAIR HFA) 108 (90 BASE) MCG/ACT inhaler Inhale 2 puffs into the lungs every 4 (four) hours as needed.     [provider]  alfuzosin (UROXATRAL) 10 MG 24 hr tablet Take 10 mg by mouth daily. 08/07/19   [provider]  buprenorphine (BUTRANS) 20 MCG/HR PTWK Place 1 patch onto the skin once a week. 07/21/19   [provider]  clonazePAM (KLONOPIN) 1 MG tablet Take 1 mg by mouth 2 (two) times daily as needed for anxiety.  07/20/17   [provider]  DULoxetine (CYMBALTA) 60 MG capsule Take 60 mg by mouth every evening.     [provider]  eszopiclone (LUNESTA) 2 MG TABS tablet Take 2 mg by mouth at bedtime. Take immediately before bedtime    [provider]  Fluticasone-Salmeterol (ADVAIR DISKUS) 250-50 MCG/DOSE AEPB Inhale 1 puff into the lungs 2 (two) times daily.  10/08/14   [provider]  furosemide (LASIX) 20 MG tablet Take 20 mg by mouth daily. 07/10/19   [provider]  levocetirizine (XYZAL) 5 MG tablet Take 5 mg by mouth daily.  07/16/17   [provider]  loratadine (CLARITIN) 10 MG tablet Take 5 mg by mouth every evening.     [provider]  NARCAN 4 MG/0.1ML LIQD nasal spray kit  07/20/17   [provider]  ondansetron (ZOFRAN ODT) 4 MG  disintegrating tablet Take 1 tablet (4 mg total) by mouth every 8 (eight) hours as needed. 08/11/19   Long, Wonda Olds, MD  oxyCODONE (OXY IR/ROXICODONE) 5 MG immediate release tablet Take 5 mg by mouth every 4 (four) hours as needed for pain. 08/22/19   [provider]  pantoprazole (PROTONIX) 40 MG tablet Take 40 mg by mouth daily. 03/13/19   [provider]  [place] 2.5 MCG/ACT AERS  07/19/17   [provider]     Allergies:     Allergies  Allergen Reactions  . Oxycodone Itching     Physical Exam:   Vitals RR 24, HR 88, Temp 98.2  Gen:- Awake Alert, cachectic appearing, more comfortable,  HEENT:- [place].AT, No sclera icterus Neck-Supple Neck,No JVD,.  Lungs-  CTAB , fair symmetrical air movement CV- S1, S2 normal, regular  Abd-  +ve B.Sounds, Abd Soft, No tenderness, less distended Extremity/Skin:- No  edema, pedal pulses present  Psych-affect is flat, sleepy  neuro-generalized weakness  somewhat lethargic pain medications     Data Review:    CBC Recent Labs  Lab 08/14/2019 2152  WBC 10.2  HGB 10.4*  HCT 34.5*  PLT 630*  MCV 76.8*  MCH 23.2*  MCHC 30.1  RDW 24.3*  LYMPHSABS 1.3  MONOABS 0.8  EOSABS 0.0  BASOSABS 0.0   ------------------------------------------------------------------------------------------------------------------  Chemistries  Recent Labs  Lab 08/17/2019 2152  NA 135  K 3.3*  CL 99  CO2 23  GLUCOSE 114*  BUN 24*  CREATININE 0.91  CALCIUM 8.1*   ------------------------------------------------------------------------------------------------------------------ estimated creatinine clearance is 41.2 mL/min (by C-G formula based on SCr of 0.91 mg/dL). ------------------------------------------------------------------------------------------------------------------ No results for input(s): TSH, T4TOTAL, T3FREE, THYROIDAB in the last 72 hours.  Invalid input(s): FREET3   Coagulation profile No results for  input(s): INR, PROTIME in the last 168 hours. ------------------------------------------------------------------------------------------------------------------- No results for input(s): DDIMER in the last 72 hours. -------------------------------------------------------------------------------------------------------------------  Cardiac Enzymes No results for input(s): CKMB, TROPONINI, MYOGLOBIN in the last 168 hours.  Invalid input(s): CK ------------------------------------------------------------------------------------------------------------------ No results found for: BNP   ---------------------------------------------------------------------------------------------------------------  Urinalysis No results found for: COLORURINE, APPEARANCEUR, LABSPEC, [place], GLUCOSEU, HGBUR, BILIRUBINUR, KETONESUR, PROTEINUR, UROBILINOGEN, NITRITE, LEUKOCYTESUR  ----------------------------------------------------------------------------------------------------------------   Imaging Results:    No results found.  Radiological Exams on Admission: No results found.  DVT Prophylaxis -SCD  AM Labs Ordered, also please review Full Orders  Family Communication: Admission, patients condition and plan of care including tests being ordered have been discussed with the patient's daughter Lattie Haw who indicate understanding and agree with the plan   Code Status -DNR comfort care Likely DC to residential hospice  Condition   --prognosis is grave, anticipate date over the next several hours  Roxan Hockey M.D on Sep 26, 2019 at 10:02 AM Go to [email or web address] -  for contact info  [facility] - Office  [phone]

## 2019-09-06 DEATH — deceased

## 2021-12-13 IMAGING — DX DG CHEST 1V PORT
1 series · 1 of 1 positions shown · non-contrast
Comparison: Portable exam 5888 hours compared to 04/29/2017

CLINICAL DATA: Cough and shortness of breath for several months,
prostate cancer, COPD, smoker, COVID test pending

EXAM:
PORTABLE CHEST 1 VIEW

[chest ap]
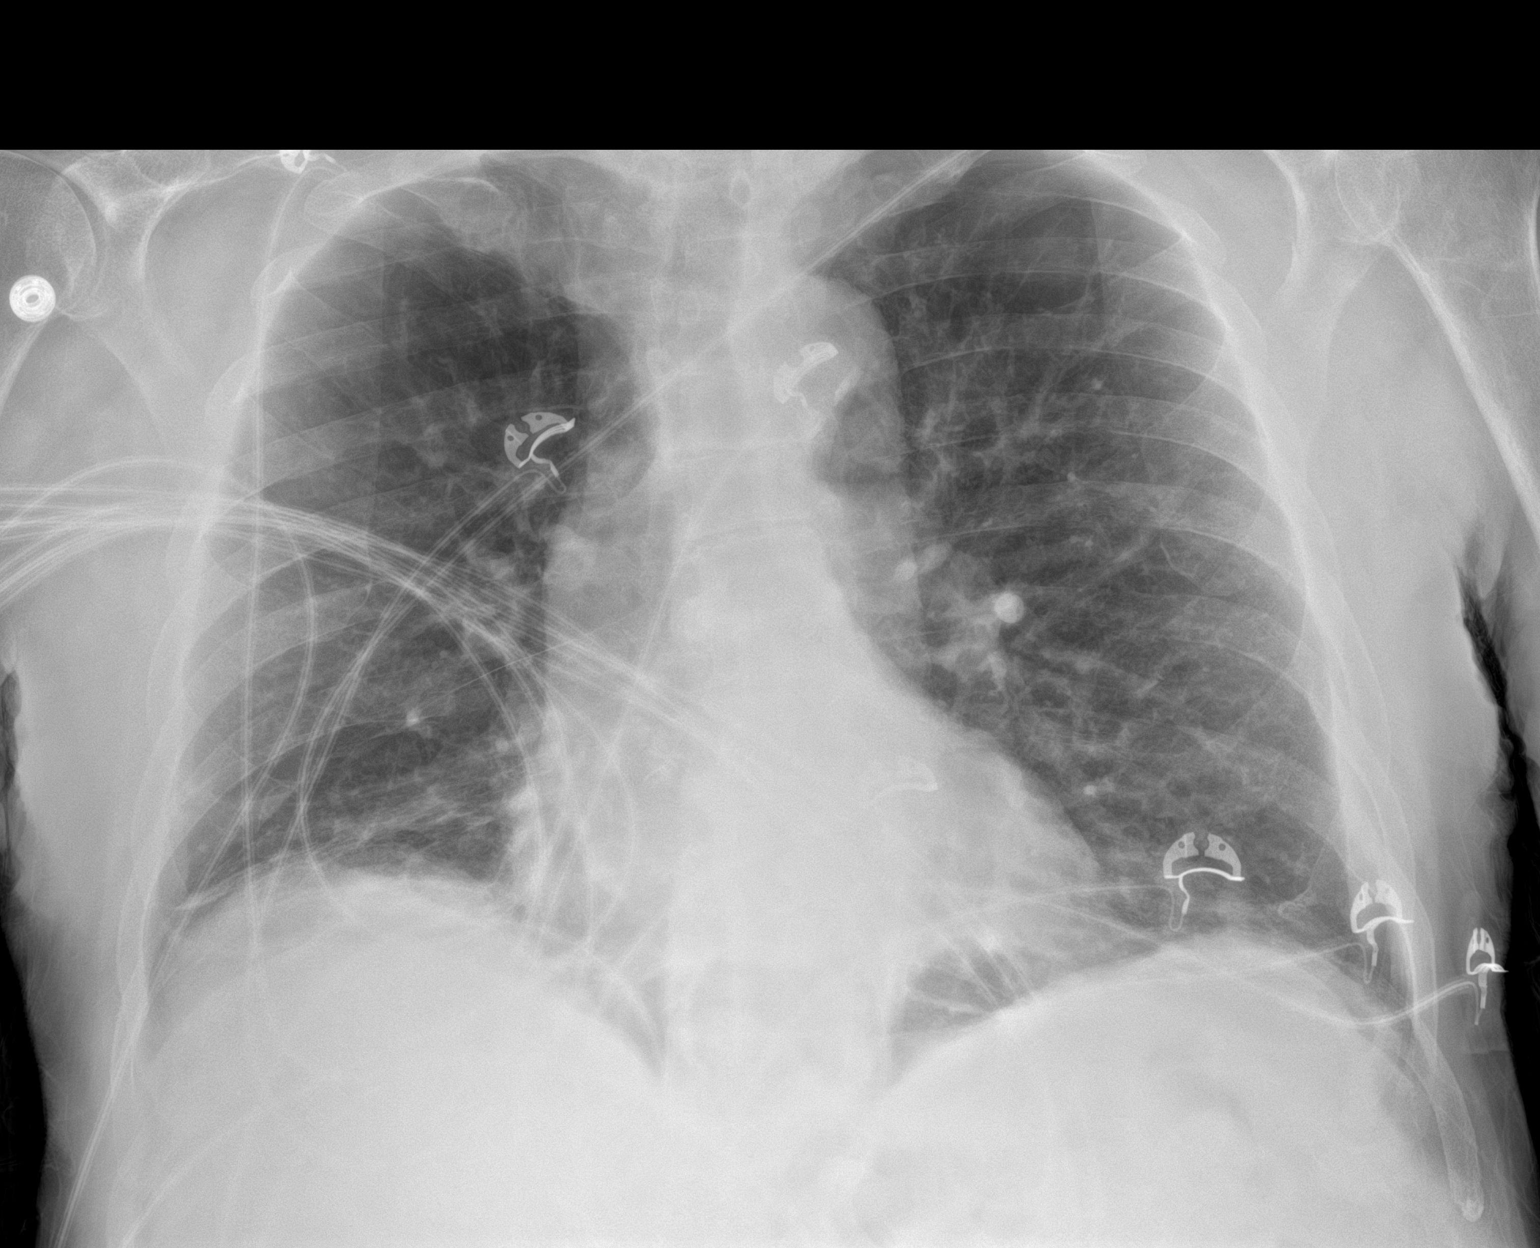

[1 of 1 positions shown; findings below may reference images not displayed]

FINDINGS: Normal heart size, mediastinal contours, and pulmonary vascularity.

Atherosclerotic calcification aorta.

Bibasilar atelectasis greater on RIGHT.

No definite infiltrate, pleural effusion or pneumothorax.

Bones demineralized with postsurgical changes of the inferior
cervical spine.
IMPRESSION: Bibasilar atelectasis greater on RIGHT.

## 2022-04-28 IMAGING — DX DG CHEST 1V PORT
1 series · 1 of 1 positions shown · non-contrast
Comparison: 04/25/2019

CLINICAL DATA: Per triage note: Pt has multiple types of Cancer,
has been constipated for several days and having severe stomach
pain. Tonight the pain worsened and he is now also c/o sob, possibly
r/t the pain. Pt states no BM x 5 days

EXAM:
PORTABLE CHEST 1 VIEW

[chest ap]
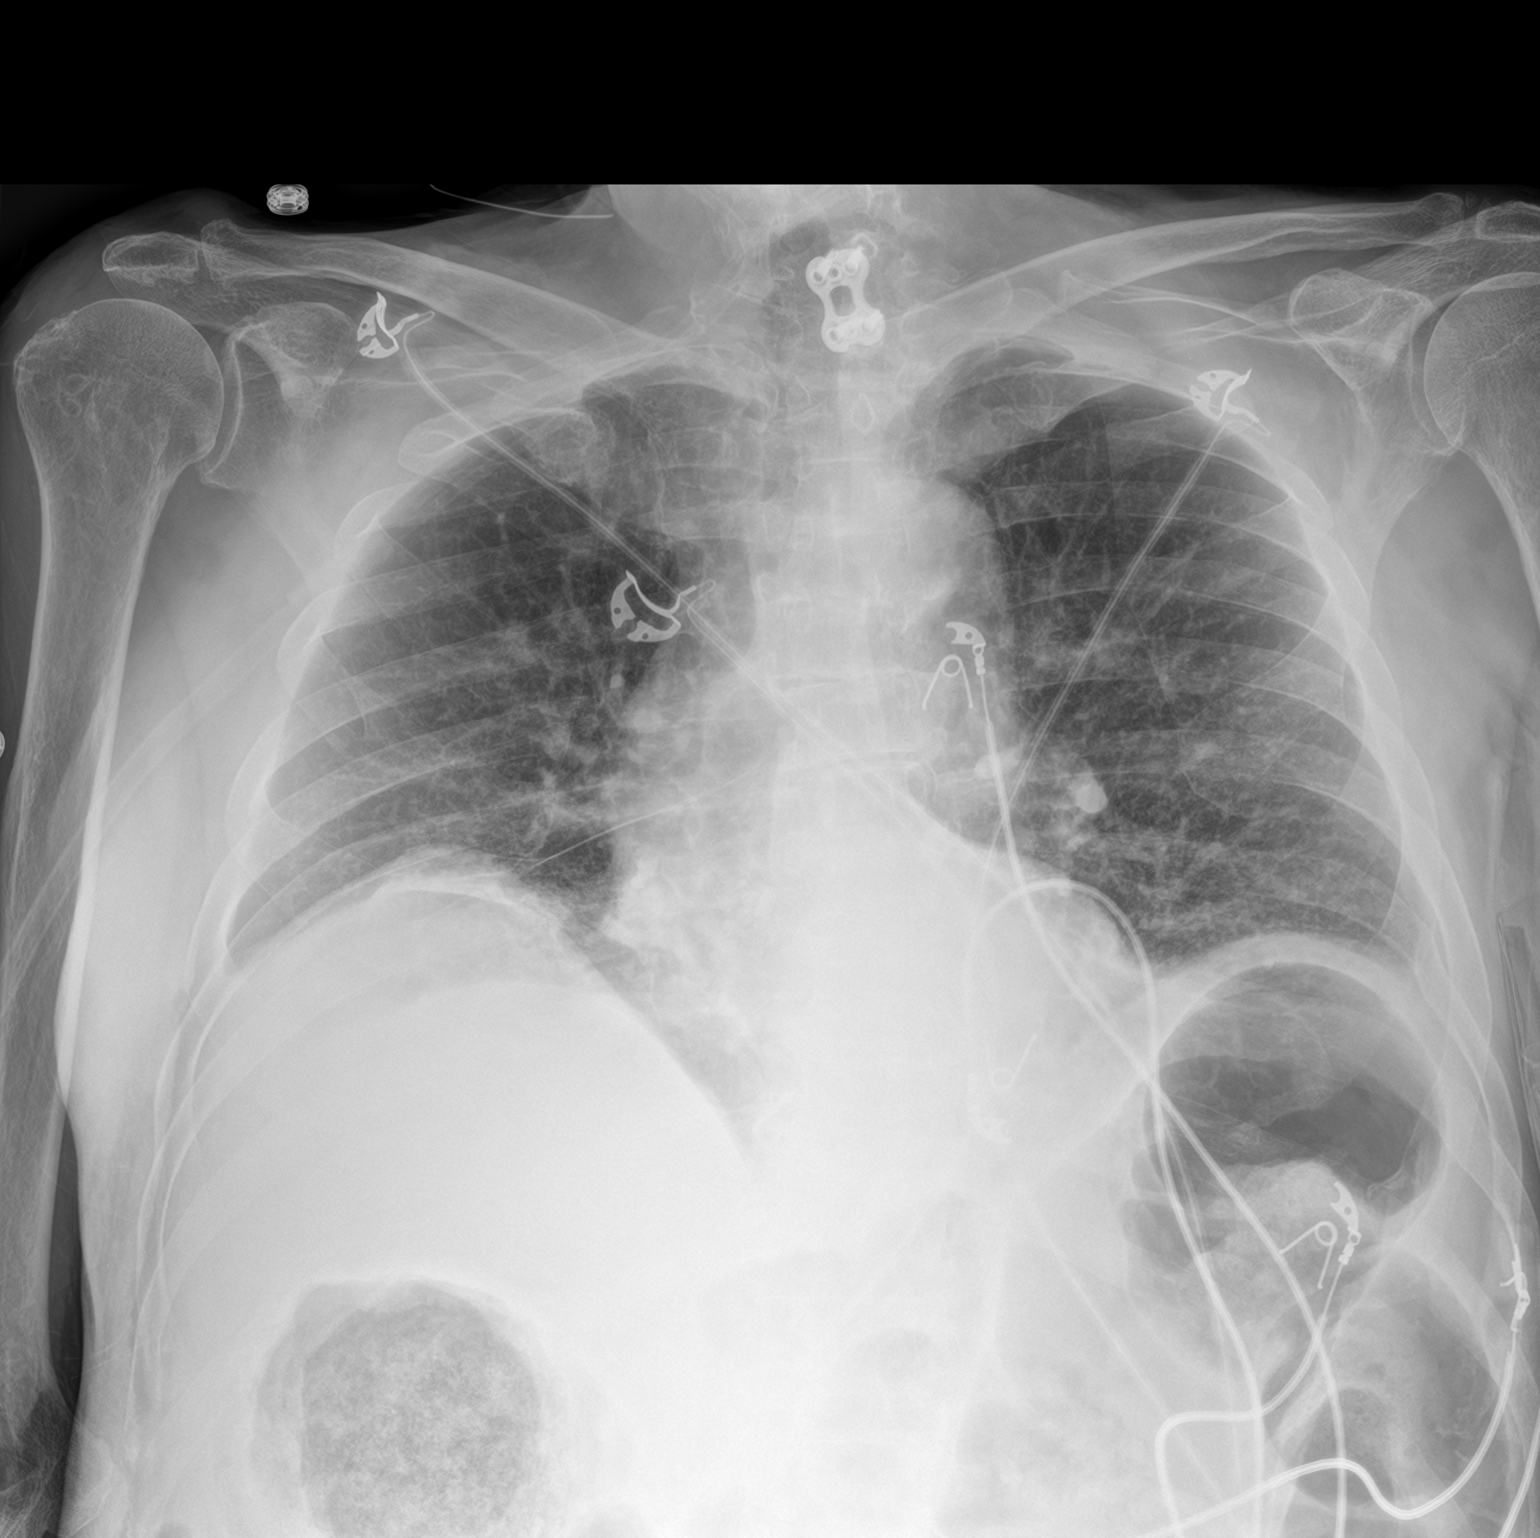

[1 of 1 positions shown; findings below may reference images not displayed]

FINDINGS: Cardiac silhouette is normal in size. No mediastinal or hilar
masses.

Low lung volumes. There is opacity at the lung bases, right greater
than left, consistent with atelectasis and similar to the prior
exam. Remainder of the lungs is clear.

No convincing pleural effusion and no pneumothorax.

Skeletal structures are demineralized but grossly intact.
IMPRESSION: 1. No acute findings.
2. Lung base opacity, right greater than left, consistent with
atelectasis and similar to the prior radiographs.
